# Patient Record
Sex: Female | Born: 1990 | Race: White | Hispanic: No | Marital: Married | Smoking: Never smoker
Health system: Southern US, Community
[De-identification: ages and names within clinical notes are randomized; demographics above are authoritative.]

## PROBLEM LIST (undated history)

## (undated) DIAGNOSIS — I319 Disease of pericardium, unspecified: Secondary | ICD-10-CM

## (undated) DIAGNOSIS — J45909 Unspecified asthma, uncomplicated: Secondary | ICD-10-CM

## (undated) DIAGNOSIS — N200 Calculus of kidney: Secondary | ICD-10-CM

## (undated) DIAGNOSIS — I639 Cerebral infarction, unspecified: Secondary | ICD-10-CM

## (undated) HISTORY — PX: TONSILLECTOMY: SUR1361

---

## 2014-11-23 ENCOUNTER — Emergency Department (HOSPITAL_COMMUNITY)
Admission: EM | Admit: 2014-11-23 | Discharge: 2014-11-23 | Disposition: A | Discharge status: Home / Self Care | Payer: Self-pay | Attending: Emergency Medicine | Admitting: Emergency Medicine

## 2014-11-23 ENCOUNTER — Encounter (HOSPITAL_COMMUNITY): Payer: Self-pay | Admitting: Emergency Medicine

## 2014-11-23 DIAGNOSIS — Z880 Allergy status to penicillin: Secondary | ICD-10-CM | POA: Insufficient documentation

## 2014-11-23 DIAGNOSIS — R1012 Left upper quadrant pain: Secondary | ICD-10-CM | POA: Insufficient documentation

## 2014-11-23 DIAGNOSIS — Z8679 Personal history of other diseases of the circulatory system: Secondary | ICD-10-CM | POA: Insufficient documentation

## 2014-11-23 DIAGNOSIS — Z8673 Personal history of transient ischemic attack (TIA), and cerebral infarction without residual deficits: Secondary | ICD-10-CM | POA: Insufficient documentation

## 2014-11-23 DIAGNOSIS — Z79899 Other long term (current) drug therapy: Secondary | ICD-10-CM | POA: Insufficient documentation

## 2014-11-23 HISTORY — DX: Cerebral infarction, unspecified: I63.9

## 2014-11-23 HISTORY — DX: Disease of pericardium, unspecified: I31.9

## 2014-11-23 LAB — HCG, QUANTITATIVE, PREGNANCY: hCG, Beta Chain, Quant, S: <1 m[IU]/mL (ref <5)

## 2014-11-23 LAB — COMPREHENSIVE METABOLIC PANEL
ALBUMIN: 4.4 g/dL (ref 3.5–5.0)
ALK PHOS: 48 U/L (ref 38–126)
ALT: 13 U/L — AB (ref 14–54)
ANION GAP: 7 (ref 5–15)
AST: 16 U/L (ref 15–41)
BUN: 16 mg/dL (ref 6–20)
CHLORIDE: 105 mmol/L (ref 101–111)
CO2: 28 mmol/L (ref 22–32)
CREATININE: 0.64 mg/dL (ref 0.44–1.00)
Calcium: 9 mg/dL (ref 8.9–10.3)
Glucose, Bld: 106 mg/dL — ABNORMAL HIGH (ref 65–99)
POTASSIUM: 3.8 mmol/L (ref 3.5–5.1)
SODIUM: 140 mmol/L (ref 135–145)
Total Bilirubin: 0.3 mg/dL (ref 0.3–1.2)
Total Protein: 7.1 g/dL (ref 6.5–8.1)

## 2014-11-23 LAB — DIFFERENTIAL
BASOS ABS: 0.1 10*3/uL (ref 0.0–0.1)
BASOS PCT: 1 %
EOS ABS: 0.2 10*3/uL (ref 0.0–0.7)
Eosinophils Relative: 4 %
LYMPHS ABS: 2.3 10*3/uL (ref 0.7–4.0)
Lymphocytes Relative: 43 %
Monocytes Absolute: 0.5 10*3/uL (ref 0.1–1.0)
Monocytes Relative: 9 %
NEUTROS PCT: 43 %
Neutro Abs: 2.3 10*3/uL (ref 1.7–7.7)

## 2014-11-23 LAB — URINE MICROSCOPIC-ADD ON

## 2014-11-23 LAB — I-STAT BETA HCG BLOOD, ED (MC, WL, AP ONLY)

## 2014-11-23 LAB — URINALYSIS, ROUTINE W REFLEX MICROSCOPIC
Bilirubin Urine: NEGATIVE
Glucose, UA: NEGATIVE mg/dL
KETONES UR: NEGATIVE mg/dL
LEUKOCYTES UA: NEGATIVE
NITRITE: NEGATIVE
PH: 6.5 (ref 5.0–8.0)
PROTEIN: NEGATIVE mg/dL
Specific Gravity, Urine: 1.021 (ref 1.005–1.030)
Urobilinogen, UA: 0.2 mg/dL (ref 0.0–1.0)

## 2014-11-23 LAB — LIPASE, BLOOD: LIPASE: 31 U/L (ref 11–51)

## 2014-11-23 LAB — CBC
HCT: 41.7 % (ref 36.0–46.0)
HEMOGLOBIN: 13.9 g/dL (ref 12.0–15.0)
MCH: 29.1 pg (ref 26.0–34.0)
MCHC: 33.3 g/dL (ref 30.0–36.0)
MCV: 87.2 fL (ref 78.0–100.0)
PLATELETS: 215 10*3/uL (ref 150–400)
RBC: 4.78 MIL/uL (ref 3.87–5.11)
RDW: 14.3 % (ref 11.5–15.5)
WBC: 5.3 10*3/uL (ref 4.0–10.5)

## 2014-11-23 MED ORDER — ONDANSETRON 4 MG PO TBDP: ORAL_TABLET | ORAL | Status: DC

## 2014-11-23 MED ORDER — MORPHINE SULFATE (PF) 2 MG/ML IV SOLN
2.0000 mg | Freq: Once | INTRAVENOUS | Status: AC
  Administered 2014-11-23: 2 mg via INTRAVENOUS
  Filled 2014-11-23: qty 1

## 2014-11-23 MED ORDER — SODIUM CHLORIDE 0.9 % IV BOLUS (SEPSIS)
1000.0000 mL | Freq: Once | INTRAVENOUS | Status: AC
  Administered 2014-11-23: 1000 mL via INTRAVENOUS

## 2014-11-23 MED ORDER — LIDOCAINE VISCOUS 2 % MT SOLN
15.0000 mL | Freq: Once | OROMUCOSAL | Status: AC
  Administered 2014-11-23: 15 mL via OROMUCOSAL
  Filled 2014-11-23: qty 15

## 2014-11-23 MED ORDER — ALUM & MAG HYDROXIDE-SIMETH 200-200-20 MG/5ML PO SUSP
15.0000 mL | Freq: Once | ORAL | Status: AC
  Administered 2014-11-23: 15 mL via ORAL
  Filled 2014-11-23: qty 30

## 2014-11-23 MED ORDER — ONDANSETRON HCL 4 MG/2ML IJ SOLN
4.0000 mg | Freq: Once | INTRAMUSCULAR | Status: AC
  Administered 2014-11-23: 4 mg via INTRAVENOUS
  Filled 2014-11-23: qty 2

## 2014-11-23 NOTE — ED Notes (Signed)
Patient c/o LUQ pain, sharp in nature for several days, patient states pain is progressively worsening. Patient reports emesis too numerous to count. Denies fever. Denies diarrhea.

## 2014-11-23 NOTE — Discharge Instructions (Signed)
Try Zantac twice a day for your pain Abdominal Pain, Adult Many things can cause abdominal pain. Usually, abdominal pain is not caused by a disease and will improve without treatment. It can often be observed and treated at home. Your health care provider will do a physical exam and possibly order blood tests and X-rays to help determine the seriousness of your pain. However, in many cases, more time must pass before a clear cause of the pain can be found. Before that point, your health care provider may not know if you need more testing or further treatment. HOME CARE INSTRUCTIONS Monitor your abdominal pain for any changes. The following actions may help to alleviate any discomfort you are experiencing:  Only take over-the-counter or prescription medicines as directed by your health care provider.  Do not take laxatives unless directed to do so by your health care provider.  Try a clear liquid diet (broth, tea, or water) as directed by your health care provider. Slowly move to a bland diet as tolerated. SEEK MEDICAL CARE IF:  You have unexplained abdominal pain.  You have abdominal pain associated with nausea or diarrhea.  You have pain when you urinate or have a bowel movement.  You experience abdominal pain that wakes you in the night.  You have abdominal pain that is worsened or improved by eating food.  You have abdominal pain that is worsened with eating fatty foods.  You have a fever. SEEK IMMEDIATE MEDICAL CARE IF:  Your pain does not go away within 2 hours.  You keep throwing up (vomiting).  Your pain is felt only in portions of the abdomen, such as the right side or the left lower portion of the abdomen.  You pass bloody or black tarry stools. MAKE SURE YOU:  Understand these instructions.  Will watch your condition.  Will get help right away if you are not doing well or get worse.   This information is not intended to replace advice given to you by your health  care provider. Make sure you discuss any questions you have with your health care provider.   Document Released: 10/11/2004 Document Revised: 09/22/2014 Document Reviewed: 09/10/2012 Elsevier Interactive Patient Education [employer]2016 [employer]vier Inc.

## 2014-11-23 NOTE — ED Provider Notes (Signed)
CSN: [id number]     Arrival date & time 11/23/14  0845 History   First MD Initiated Contact with Patient 11/23/14 0902     Chief Complaint  Patient presents with  . Abdominal Pain    LUQ     (Consider location/radiation/quality/duration/timing/severity/associated sxs/prior Treatment) Patient is a 24 y.o. female presenting with abdominal pain. The history is provided by the patient.  Abdominal Pain Pain location:  LUQ Pain quality: sharp and shooting   Pain radiates to:  Does not radiate Pain severity:  Moderate Onset quality:  Gradual Duration:  4 days Timing:  Constant Progression:  Worsening Chronicity:  New Relieved by:  Nothing Worsened by:  Nothing tried Ineffective treatments:  None tried Associated symptoms: no chest pain, no chills, no constipation, no diarrhea, no dysuria, no fever, no nausea, no shortness of breath, no vaginal bleeding, no vaginal discharge and no vomiting     24 yo F with a chief complaint of epigastric and left upper quadrant abdominal pain. This been going on for the past few days. Patient has had some significant vomiting with this as well. Able to tolerate some liquids at home. Patient having some subjective chills. Denies fevers. Denies diarrhea. Denies suspicious food intake. Patient denies bilious or bloody emesis. Pain was initially colicky now she feels is constant. Denies shortness of breath.  Past Medical History  Diagnosis Date  . Pericarditis   . Stroke [facility](HCC)     x2   Past Surgical History  Procedure Laterality Date  . Tonsillectomy     No family history on file. Social History  Substance Use Topics  . Smoking status: Never Smoker   . Smokeless tobacco: None  . Alcohol Use: Yes     Comment: occ   OB History    No data available     Review of Systems  Constitutional: Negative for fever and chills.  HENT: Negative for congestion and rhinorrhea.   Eyes: Negative for redness and visual disturbance.  Respiratory: Negative for  shortness of breath and wheezing.   Cardiovascular: Negative for chest pain and palpitations.  Gastrointestinal: Positive for abdominal pain. Negative for nausea, vomiting, diarrhea and constipation.  Genitourinary: Negative for dysuria, urgency, vaginal bleeding and vaginal discharge.  Musculoskeletal: Negative for myalgias and arthralgias.  Skin: Negative for pallor and wound.  Neurological: Negative for dizziness and headaches.      Allergies  Peanuts and Penicillins  Home Medications   Prior to Admission medications   Medication Sig Start Date End Date Taking? Authorizing Provider  albuterol (PROVENTIL HFA;VENTOLIN HFA) 108 (90 BASE) MCG/ACT inhaler Inhale 2 puffs into the lungs every 6 (six) hours as needed for wheezing or shortness of breath.   Yes Historical Provider, MD  ibuprofen (ADVIL,MOTRIN) 200 MG tablet Take 400 mg by mouth every 6 (six) hours as needed for fever, headache, mild pain, moderate pain or cramping.   Yes Historical Provider, MD  ondansetron (ZOFRAN ODT) 4 MG disintegrating tablet 4mg  ODT q4 hours prn nausea/vomit 11/23/14   Melene Planan Iliyana Convey, DO   BP 116/79 mmHg  Pulse 99  Temp(Src) 98.3 F (36.8 C) (Oral)  Resp 18  Ht 5\' 3"  (1.6 m)  Wt 105 lb (47.628 kg)  BMI 18.60 kg/m2  SpO2 99%  LMP 11/04/2014 (Exact Date) Physical Exam  Constitutional: She is oriented to person, place, and time. She appears well-developed and well-nourished. No distress.  HENT:  Head: Normocephalic and atraumatic.  Eyes: EOM are normal. Pupils are equal, round, and reactive to  light.  Neck: Normal range of motion. Neck supple.  Cardiovascular: Normal rate and regular rhythm.  Exam reveals no gallop and no friction rub.   No murmur heard. Pulmonary/Chest: Effort normal. She has no wheezes. She has no rales.  Abdominal: Soft. She exhibits no distension. There is tenderness (mild epigastric). There is no rebound and no guarding.  Musculoskeletal: She exhibits no edema or tenderness.   Neurological: She is alert and oriented to person, place, and time.  Skin: Skin is warm and dry. She is not diaphoretic.  Psychiatric: She has a normal mood and affect. Her behavior is normal.  Nursing note and vitals reviewed.   ED Course  Procedures (including critical care time) Labs Review Labs Reviewed  COMPREHENSIVE METABOLIC PANEL - Abnormal; Notable for the following:    Glucose, Bld 106 (*)    ALT 13 (*)    All other components within normal limits  URINALYSIS, ROUTINE W REFLEX MICROSCOPIC (NOT AT [facility]) - Abnormal; Notable for the following:    APPearance CLOUDY (*)    Hgb urine dipstick SMALL (*)    All other components within normal limits  LIPASE, BLOOD  CBC  HCG, QUANTITATIVE, PREGNANCY  DIFFERENTIAL  URINE MICROSCOPIC-ADD ON  I-STAT BETA HCG BLOOD, ED ([facility], [facility], [facility] ONLY)    Imaging Review No results found. I have personally reviewed and evaluated these images and lab results as part of my medical decision-making.   EKG Interpretation None      MDM   Final diagnoses:  Left upper quadrant pain    24 yo F with a chief complaint of left upper quadrant abdominal pain. This been going on for about 3 or 4 days. Patient with a benign abdominal exam. Likely gastritis secondary to vomiting. Will treat with Zofran and GI cocktail check CBC CMP lipase.  Pregnancy test lost, will have to be re run in the lab.  Pregnant test negative. Patient feeling much better after GI cocktail. Able to tolerate PODischarge home.   I have discussed the diagnosis/risks/treatment options with the patient and family and believe the pt to be eligible for discharge home to follow-up with PCP. We also discussed returning to the ED immediately if new or worsening sx occur. We discussed the sx which are most concerning (e.g., sudden worsening pain, fever, inability to tolerate by mouth ) that necessitate immediate return. Medications administered to the patient during their visit and any new  prescriptions provided to the patient are listed below.  Medications given during this visit Medications  ondansetron (ZOFRAN) injection 4 mg (4 mg Intravenous Given 11/23/14 0943)  morphine 2 MG/ML injection 2 mg (2 mg Intravenous Given 11/23/14 0942)  alum & mag hydroxide-simeth (MAALOX/MYLANTA) 200-200-20 MG/5ML suspension 15 mL (15 mLs Oral Given 11/23/14 0944)  lidocaine (XYLOCAINE) 2 % viscous mouth solution 15 mL (15 mLs Mouth/Throat Given 11/23/14 0944)  sodium chloride 0.9 % bolus 1,000 mL (0 mLs Intravenous Stopped 11/23/14 1040)    Discharge Medication List as of 11/23/2014 11:44 AM      The patient appears reasonably screen and/or stabilized for discharge and I doubt any other medical condition or other [facility] requiring further screening, evaluation, or treatment in the ED at this time prior to discharge.    Melene Plan, DO 11/23/14 1527

## 2014-12-15 ENCOUNTER — Encounter (HOSPITAL_COMMUNITY): Payer: Self-pay | Admitting: Emergency Medicine

## 2014-12-15 ENCOUNTER — Emergency Department (INDEPENDENT_AMBULATORY_CARE_PROVIDER_SITE_OTHER)
Admission: EM | Admit: 2014-12-15 | Discharge: 2014-12-15 | Disposition: A | Discharge status: Home / Self Care | Payer: Self-pay | Source: Home / Self Care

## 2014-12-15 DIAGNOSIS — R1084 Generalized abdominal pain: Secondary | ICD-10-CM

## 2014-12-15 DIAGNOSIS — R634 Abnormal weight loss: Secondary | ICD-10-CM

## 2014-12-15 LAB — POCT URINALYSIS DIP (DEVICE)
Bilirubin Urine: NEGATIVE
Glucose, UA: NEGATIVE mg/dL
KETONES UR: NEGATIVE mg/dL
LEUKOCYTES UA: NEGATIVE
Nitrite: NEGATIVE
Protein, ur: NEGATIVE mg/dL
UROBILINOGEN UA: 0.2 mg/dL (ref 0.0–1.0)
pH: 6 (ref 5.0–8.0)

## 2014-12-15 LAB — POCT I-STAT, CHEM 8
BUN: 14 mg/dL (ref 6–20)
CALCIUM ION: 1.24 mmol/L — AB (ref 1.12–1.23)
Chloride: 101 mmol/L (ref 101–111)
Creatinine, Ser: 0.7 mg/dL (ref 0.44–1.00)
Glucose, Bld: 98 mg/dL (ref 65–99)
HCT: 49 % — ABNORMAL HIGH (ref 36.0–46.0)
HEMOGLOBIN: 16.7 g/dL — AB (ref 12.0–15.0)
Potassium: 4.2 mmol/L (ref 3.5–5.1)
SODIUM: 140 mmol/L (ref 135–145)
TCO2: 24 mmol/L (ref 0–100)

## 2014-12-15 LAB — POCT PREGNANCY, URINE: Preg Test, Ur: NEGATIVE

## 2014-12-15 LAB — POCT H PYLORI SCREEN: H. PYLORI SCREEN, POC: NEGATIVE

## 2014-12-15 MED ORDER — ONDANSETRON 4 MG PO TBDP
4.0000 mg | ORAL_TABLET | Freq: Once | ORAL | Status: AC
  Administered 2014-12-15: 4 mg via ORAL

## 2014-12-15 MED ORDER — PROMETHAZINE HCL 12.5 MG PO TABS: 12.5000 mg | ORAL_TABLET | Freq: Four times a day (QID) | ORAL | Status: DC | PRN

## 2014-12-15 MED ORDER — ONDANSETRON 4 MG PO TBDP
ORAL_TABLET | ORAL | Status: AC
  Filled 2014-12-15: qty 1

## 2014-12-15 NOTE — ED Notes (Signed)
Complains of left upper abdominal pain at lower rib cage, denies radiation. Pain started 3 weeks ago, stopped, then resumed 1 1/2 weeks ago.  Patient has nausea, not triggered by anything per patient.  Patient reports being seen in ed, prescribed zofran-but too expensive and did not get this filled.  Patient also reports vaginal bleeding for 2 months.  Denies any change in medicines or changes in birth control.  No pcp.

## 2014-12-15 NOTE — Discharge Instructions (Signed)

## 2014-12-15 NOTE — ED Provider Notes (Signed)
CSN: [id number]     Arrival date & time 12/15/14  1321 History   None    Chief Complaint  Patient presents with  . Abdominal Pain   (Consider location/radiation/quality/duration/timing/severity/associated sxs/prior Treatment) HPI Abdominal pain for over 3 weeks. Has been seen in the ED without any imaging, also has had vaginal bleeding for over 2 months. Was treated with zofran but unable to get rx due to cost. Upper abdo pain without radiation. Some weight loss. Not eating well, trying to take fluids.  Past Medical History  Diagnosis Date  . Pericarditis   . Stroke [facility](HCC)     x2   Past Surgical History  Procedure Laterality Date  . Tonsillectomy     History reviewed. No pertinent family history. Social History  Substance Use Topics  . Smoking status: Never Smoker   . Smokeless tobacco: None  . Alcohol Use: Yes     Comment: occ   OB History    No data available     Review of Systems  ROS +'venausea abdo pain  Denies: HEADACHE, CHEST PAIN, CONGESTION, DYSURIA, SHORTNESS OF BREATH   Allergies  Peanuts and Penicillins  Home Medications   Prior to Admission medications   Medication Sig Start Date End Date Taking? Authorizing Provider  albuterol (PROVENTIL HFA;VENTOLIN HFA) 108 (90 BASE) MCG/ACT inhaler Inhale 2 puffs into the lungs every 6 (six) hours as needed for wheezing or shortness of breath.    Historical Provider, MD  ibuprofen (ADVIL,MOTRIN) 200 MG tablet Take 400 mg by mouth every 6 (six) hours as needed for fever, headache, mild pain, moderate pain or cramping.    Historical Provider, MD  ondansetron (ZOFRAN ODT) 4 MG disintegrating tablet 4mg  ODT q4 hours prn nausea/vomit 11/23/14   Melene Planan Floyd, DO  promethazine (PHENERGAN) 12.5 MG tablet Take 1 tablet (12.5 mg total) by mouth every 6 (six) hours as needed for nausea or vomiting. 12/15/14   Tharon AquasFrank C Patrick, PA   Meds Ordered and Administered this Visit   Medications  promethazine (PHENERGAN) tablet 12.5 mg (not  administered)  ondansetron (ZOFRAN-ODT) disintegrating tablet 4 mg (4 mg Oral Given 12/15/14 1531)    BP 123/87 mmHg  Pulse 68  Temp(Src) 99.3 F (37.4 C) (Oral)  Resp 16  SpO2 99%  LMP 11/04/2014 (Exact Date) No data found.   Physical Exam  Constitutional: She is oriented to person, place, and time. She appears well-developed and well-nourished.  HENT:  Head: Normocephalic and atraumatic.  Pulmonary/Chest: Effort normal and breath sounds normal.  Abdominal: Soft. Bowel sounds are normal. She exhibits no distension. There is no tenderness. There is no rebound and no guarding.  Musculoskeletal: Normal range of motion.  Neurological: She is alert and oriented to person, place, and time.  Skin: Skin is warm and dry.  Psychiatric: She has a normal mood and affect. Her behavior is normal. Judgment and thought content normal.    ED Course  Procedures (including critical care time)  Labs Review Labs Reviewed  POCT URINALYSIS DIP (DEVICE) - Abnormal; Notable for the following:    Hgb urine dipstick MODERATE (*)    All other components within normal limits  POCT I-STAT, CHEM 8 - Abnormal; Notable for the following:    Calcium, Ion 1.24 (*)    Hemoglobin 16.7 (*)    HCT 49.0 (*)    All other components within normal limits  POCT PREGNANCY, URINE  POCT H PYLORI SCREEN    Imaging Review No results found.   Visual  Acuity Review  Right Eye Distance:   Left Eye Distance:   Bilateral Distance:    Right Eye Near:   Left Eye Near:    Bilateral Near:         MDM   1. Generalized abdominal pain   2. Weight loss    No direct or obvious cause for patients symptomatology. Suggest following up as needed.  Most likely will need some investigations to get to this resolve.  Rx phenergan Community resource provided to help find PCP  Tharon Aquas, PA 12/15/14 1647  Tharon Aquas, [place] 12/15/14 2033

## 2015-08-11 ENCOUNTER — Emergency Department (HOSPITAL_COMMUNITY): Payer: Managed Care, Other (non HMO)

## 2015-08-11 ENCOUNTER — Encounter (HOSPITAL_COMMUNITY): Payer: Self-pay | Admitting: Emergency Medicine

## 2015-08-11 ENCOUNTER — Emergency Department (HOSPITAL_COMMUNITY)
Admission: EM | Admit: 2015-08-11 | Discharge: 2015-08-11 | Disposition: A | Discharge status: Home / Self Care | Payer: Managed Care, Other (non HMO) | Attending: Physician Assistant | Admitting: Physician Assistant

## 2015-08-11 DIAGNOSIS — R42 Dizziness and giddiness: Secondary | ICD-10-CM | POA: Diagnosis present

## 2015-08-11 DIAGNOSIS — R55 Syncope and collapse: Secondary | ICD-10-CM | POA: Diagnosis not present

## 2015-08-11 DIAGNOSIS — Z791 Long term (current) use of non-steroidal anti-inflammatories (NSAID): Secondary | ICD-10-CM | POA: Insufficient documentation

## 2015-08-11 DIAGNOSIS — R109 Unspecified abdominal pain: Secondary | ICD-10-CM | POA: Diagnosis not present

## 2015-08-11 DIAGNOSIS — Z8673 Personal history of transient ischemic attack (TIA), and cerebral infarction without residual deficits: Secondary | ICD-10-CM | POA: Insufficient documentation

## 2015-08-11 DIAGNOSIS — Z79899 Other long term (current) drug therapy: Secondary | ICD-10-CM | POA: Diagnosis not present

## 2015-08-11 LAB — CBC
HEMATOCRIT: 41.9 % (ref 36.0–46.0)
HEMOGLOBIN: 14.1 g/dL (ref 12.0–15.0)
MCH: 29.4 pg (ref 26.0–34.0)
MCHC: 33.7 g/dL (ref 30.0–36.0)
MCV: 87.5 fL (ref 78.0–100.0)
Platelets: 255 10*3/uL (ref 150–400)
RBC: 4.79 MIL/uL (ref 3.87–5.11)
RDW: 13.1 % (ref 11.5–15.5)
WBC: 8.9 10*3/uL (ref 4.0–10.5)

## 2015-08-11 LAB — URINE MICROSCOPIC-ADD ON

## 2015-08-11 LAB — URINALYSIS, ROUTINE W REFLEX MICROSCOPIC
BILIRUBIN URINE: NEGATIVE
Glucose, UA: NEGATIVE mg/dL
Ketones, ur: NEGATIVE mg/dL
Leukocytes, UA: NEGATIVE
Nitrite: NEGATIVE
PH: 6 (ref 5.0–8.0)
Protein, ur: NEGATIVE mg/dL
SPECIFIC GRAVITY, URINE: 1.021 (ref 1.005–1.030)

## 2015-08-11 LAB — BASIC METABOLIC PANEL
Anion gap: 6 (ref 5–15)
BUN: 9 mg/dL (ref 6–20)
CHLORIDE: 106 mmol/L (ref 101–111)
CO2: 25 mmol/L (ref 22–32)
Calcium: 8.8 mg/dL — ABNORMAL LOW (ref 8.9–10.3)
Creatinine, Ser: 0.69 mg/dL (ref 0.44–1.00)
GFR calc non Af Amer: >60 mL/min (ref >60)
Glucose, Bld: 120 mg/dL — ABNORMAL HIGH (ref 65–99)
Potassium: 3.2 mmol/L — ABNORMAL LOW (ref 3.5–5.1)
SODIUM: 137 mmol/L (ref 135–145)

## 2015-08-11 LAB — I-STAT BETA HCG BLOOD, ED (MC, WL, AP ONLY)

## 2015-08-11 LAB — RAPID URINE DRUG SCREEN, HOSP PERFORMED
AMPHETAMINES: NOT DETECTED
BARBITURATES: NOT DETECTED
Benzodiazepines: NOT DETECTED
COCAINE: NOT DETECTED
Opiates: NOT DETECTED
Tetrahydrocannabinol: POSITIVE — AB

## 2015-08-11 MED ORDER — SODIUM CHLORIDE 0.9 % IV BOLUS (SEPSIS)
1000.0000 mL | Freq: Once | INTRAVENOUS | Status: AC
  Administered 2015-08-11: 1000 mL via INTRAVENOUS

## 2015-08-11 MED ORDER — DIAZEPAM 5 MG/ML IJ SOLN
2.5000 mg | Freq: Once | INTRAMUSCULAR | Status: AC
  Administered 2015-08-11: 2.5 mg via INTRAVENOUS
  Filled 2015-08-11: qty 2

## 2015-08-11 MED ORDER — MECLIZINE HCL 25 MG PO TABS: 25.0000 mg | ORAL_TABLET | Freq: Three times a day (TID) | ORAL | 0 refills | Status: DC | PRN

## 2015-08-11 MED ORDER — KETOROLAC TROMETHAMINE 30 MG/ML IJ SOLN
30.0000 mg | Freq: Once | INTRAMUSCULAR | Status: AC
  Administered 2015-08-11: 30 mg via INTRAVENOUS
  Filled 2015-08-11: qty 1

## 2015-08-11 NOTE — ED Provider Notes (Signed)
[facility]-EMERGENCY DEPT Provider Note   CSN: [id number] Arrival date & time: 08/11/15  1625  First Provider Contact:  None       History   Chief Complaint Chief Complaint  Patient presents with  . Dizziness    and Headache    HPI Robin Lambert is a 25 y.o. female.  Robin Lambert is a 25 y.o. female with a history of pericarditis and self-reported CVA, presents emergency department after syncopal episode 2 days ago. She states that she got up out of sitting position and states she became very dizzy. She states that she then fainted. This was witnessed by her boyfriend. She states she was unconscious for several seconds. When she woke up, she remembers feeling headache. She states since then she has had headache, nausea, vomiting, diarrhea, generalized weakness. She states she has been drinking water, but still feels dehydrated. She reports dizziness has persisted since then. She denies chest pain palpitations. No recent prolonged travel. No pain or swelling in her legs.      Past Medical History:  Diagnosis Date  . Pericarditis   . Stroke [facility])    x2    There are no active problems to display for this patient.   Past Surgical History:  Procedure Laterality Date  . TONSILLECTOMY      OB History    No data available       Home Medications    Prior to Admission medications   Medication Sig Start Date End Date Taking? Authorizing Provider  albuterol (PROVENTIL HFA;VENTOLIN HFA) 108 (90 BASE) MCG/ACT inhaler Inhale 2 puffs into the lungs every 6 (six) hours as needed for wheezing or shortness of breath.   Yes Historical Provider, MD  EPINEPHrine (EPIPEN JR) 0.15 MG/0.3ML injection Inject 0.15 mg into the muscle as needed for anaphylaxis.    Yes Historical Provider, MD  ibuprofen (ADVIL,MOTRIN) 200 MG tablet Take 400 mg by mouth every 4 (four) hours as needed for fever, headache, mild pain, moderate pain or cramping.    Yes Historical Provider, MD    Family  History History reviewed. No pertinent family history.  Social History Social History  Substance Use Topics  . Smoking status: Never Smoker  . Smokeless tobacco: Not on file  . Alcohol use Yes     Comment: occ     Allergies   Peanuts [peanut oil] and Penicillins   Review of Systems Review of Systems  Constitutional: Negative for chills and fever.  Respiratory: Negative for cough, chest tightness and shortness of breath.   Cardiovascular: Negative for chest pain, palpitations and leg swelling.  Gastrointestinal: Positive for abdominal pain, nausea and vomiting. Negative for diarrhea.  Genitourinary: Negative for dysuria, flank pain, pelvic pain, vaginal bleeding, vaginal discharge and vaginal pain.  Musculoskeletal: Negative for arthralgias, myalgias, neck pain and neck stiffness.  Skin: Negative for rash.  Neurological: Positive for dizziness, syncope, light-headedness and headaches. Negative for weakness.  All other systems reviewed and are negative.    Physical Exam Updated Vital Signs BP 120/81   Pulse (!) 56   Temp 98.2 F (36.8 C) (Oral)   Resp 14   SpO2 100%   Physical Exam  Constitutional: She is oriented to person, place, and time. She appears well-developed and well-nourished. No distress.  HENT:  Head: Normocephalic.  Oral mucosa dry, lips cracked  Eyes: Conjunctivae and EOM are normal. Pupils are equal, round, and reactive to light.  No nystagmus  Neck: Normal range of motion. Neck supple.  Cardiovascular:  Normal rate, regular rhythm and normal heart sounds.   Pulmonary/Chest: Effort normal and breath sounds normal. No respiratory distress. She has no wheezes. She has no rales.  Abdominal: Soft. Bowel sounds are normal. She exhibits no distension. There is no tenderness. There is no rebound and no guarding.  Musculoskeletal: She exhibits no edema.  Neurological: She is alert and oriented to person, place, and time.  5/5 and equal upper and lower  extremity strength bilaterally. Equal grip strength bilaterally. Normal finger to nose and heel to shin. No pronator drift. Gait normal  Skin: Skin is warm and dry.  Psychiatric: She has a normal mood and affect. Her behavior is normal.  Nursing note and vitals reviewed.    ED Treatments / Results  Labs (all labs ordered are listed, but only abnormal results are displayed) Labs Reviewed  BASIC METABOLIC PANEL - Abnormal; Notable for the following:       Result Value   Potassium 3.2 (*)    Glucose, Bld 120 (*)    Calcium 8.8 (*)    All other components within normal limits  URINALYSIS, ROUTINE W REFLEX MICROSCOPIC (NOT AT [facility]) - Abnormal; Notable for the following:    Hgb urine dipstick SMALL (*)    All other components within normal limits  URINE RAPID DRUG SCREEN, HOSP PERFORMED - Abnormal; Notable for the following:    Tetrahydrocannabinol POSITIVE (*)    All other components within normal limits  URINE MICROSCOPIC-ADD ON - Abnormal; Notable for the following:    Squamous Epithelial / LPF 0-5 (*)    Bacteria, UA RARE (*)    All other components within normal limits  CBC  I-STAT BETA HCG BLOOD, ED ([facility], [facility], [facility] ONLY)    EKG  EKG Interpretation  Date/Time:  Thursday August 11 2015 17:13:39 EDT Ventricular Rate:  62 PR Interval:    QRS Duration: 100 QT Interval:  414 QTC Calculation: 421 R Axis:   84 Text Interpretation:  Sinus rhythm Short PR interval RSR' in V1 or V2, right VCD or RVH no acute ischemia. Confirmed by Kandis Mannan ([id number]) on 08/11/2015 9:09:50 PM       Radiology Ct Head Wo Contrast  Result Date: 08/11/2015 CLINICAL DATA:  Pt reports she had a syncopal episode on Tuesday evening. Larey Seat and hit her head when it occurred. Pt reports ongoing dizziness and HA since then. EXAM: CT HEAD WITHOUT CONTRAST TECHNIQUE: Contiguous axial images were obtained from the base of the skull through the vertex without intravenous contrast. COMPARISON:  None. FINDINGS:  Overall the ventricles are normal in size and configuration. There are no parenchymal masses or mass effect, no evidence of an infarct, no extra-axial masses or abnormal fluid collections and no intracranial hemorrhage. No skull fracture. Mild mucosal thickening in the left sphenoid sinus. Remaining visualized sinuses and the mastoid air cells are clear. IMPRESSION: 1. No intracranial abnormality. 2. No skull fracture. Electronically Signed   By: Amie Portland M.D.   On: 08/11/2015 19:28   Procedures Procedures (including critical care time)  Medications Ordered in ED Medications  sodium chloride 0.9 % bolus 1,000 mL (1,000 mLs Intravenous New Bag/Given 08/11/15 1918)  ketorolac (TORADOL) 30 MG/ML injection 30 mg (30 mg Intravenous Given 08/11/15 1918)  diazepam (VALIUM) injection 2.5 mg (2.5 mg Intravenous Given 08/11/15 2113)     Initial Impression / Assessment and Plan / ED Course  I have reviewed the triage vital signs and the nursing notes.  Pertinent labs & imaging results  that were available during my care of the patient were reviewed by me and considered in my medical decision making (see chart for details).  Clinical Course  Comment By Time  Patient with syncope 2 days ago, today with persistent dizziness, headache, nausea, vomiting, diarrhea. She appears to be in no acute distress, lips are dry, tongue dry. She is not orthostatic. Will start IV fluids, labs, urinalysis, CT head pending. Jaynie Crumble, PA-C 07/27 1911  Pt still dizzy. She is not orthostatic. Her CT is negative. Preg test negative. No acute findings on labs. Trop negative. Sinus rhythm on ECG. Pt now states that she feels like everything is spinning. Will try valium  Jaynie Crumble, PA-C 07/27 2110    Final Clinical Impressions(s) / ED Diagnoses   Final diagnoses:  Syncope, unspecified syncope type  Vertigo    New Prescriptions New Prescriptions   MECLIZINE (ANTIVERT) 25 MG TABLET    Take 1 tablet (25  mg total) by mouth 3 (three) times daily as needed for dizziness.     Jaynie Crumble, PA-C 08/11/15 2144    Courteney Randall An, MD 08/11/15 [phone]

## 2015-08-11 NOTE — ED Notes (Signed)
Patient transported to CT 

## 2015-08-11 NOTE — Discharge Instructions (Signed)
Make sure to rest, drink plenty of fluids. Meclizine as prescribed for dizziness. Follow up with family doctor.

## 2015-08-11 NOTE — ED Triage Notes (Signed)
Pt reports she had a syncopal episode on Tuesday evening. Larey Seat and hit her head when it occurred. Pt reports ongoing dizziness and HA since then.

## 2016-09-12 ENCOUNTER — Encounter: Payer: Self-pay | Admitting: Emergency Medicine

## 2016-09-12 ENCOUNTER — Ambulatory Visit (INDEPENDENT_AMBULATORY_CARE_PROVIDER_SITE_OTHER): Payer: Managed Care, Other (non HMO) | Admitting: Emergency Medicine

## 2016-09-12 VITALS — BP 101/62 | HR 73 | Temp 98.3°F | Resp 16 | Ht 63.0 in | Wt 115.0 lb

## 2016-09-12 DIAGNOSIS — R531 Weakness: Secondary | ICD-10-CM

## 2016-09-12 DIAGNOSIS — R42 Dizziness and giddiness: Secondary | ICD-10-CM

## 2016-09-12 LAB — POCT CBC
Granulocyte percent: 60.7 %G (ref 37–80)
HCT, POC: 40.6 % (ref 37.7–47.9)
HEMOGLOBIN: 13.8 g/dL (ref 12.2–16.2)
Lymph, poc: 2 (ref 0.6–3.4)
MCH, POC: 29.5 pg (ref 27–31.2)
MCHC: 34 g/dL (ref 31.8–35.4)
MCV: 86.6 fL (ref 80–97)
MID (cbc): 0.4 (ref 0–0.9)
MPV: 7.3 fL (ref 0–99.8)
POC Granulocyte: 3.7 (ref 2–6.9)
POC LYMPH PERCENT: 33 %L (ref 10–50)
POC MID %: 6.3 %M (ref 0–12)
Platelet Count, POC: 252 10*3/uL (ref 142–424)
RBC: 4.69 M/uL (ref 4.04–5.48)
RDW, POC: 14.2 %
WBC: 6.1 10*3/uL (ref 4.6–10.2)

## 2016-09-12 LAB — POCT URINALYSIS DIP (MANUAL ENTRY)
Bilirubin, UA: NEGATIVE
GLUCOSE UA: NEGATIVE mg/dL
Ketones, POC UA: NEGATIVE mg/dL
Leukocytes, UA: NEGATIVE
Nitrite, UA: NEGATIVE
Protein Ur, POC: NEGATIVE mg/dL
SPEC GRAV UA: 1.02 (ref 1.010–1.025)
UROBILINOGEN UA: 0.2 U/dL
pH, UA: 7.5 (ref 5.0–8.0)

## 2016-09-12 LAB — POCT URINE PREGNANCY: Preg Test, Ur: NEGATIVE

## 2016-09-12 NOTE — Patient Instructions (Addendum)
     IF you received an x-ray today, you will receive an invoice from [facility]. Please contact [facility] at [phone] with questions or concerns regarding your invoice.   IF you received labwork today, you will receive an invoice from [facility]. Please contact [facility] at [phone] with questions or concerns regarding your invoice.   Our billing staff will not be able to assist you with questions regarding bills from these companies.  You will be contacted with the lab results as soon as they are available. The fastest way to get your results is to activate your My Chart account. Instructions are located on the last page of this paperwork. If you have not heard from us regarding the results in 2 weeks, please contact this office.     Weakness Weakness is a lack of strength. You may feel weak all over your body (generalized), or you may feel weak in one specific part of your body (focal). There are many potential causes of weakness. Sometimes, the cause of your weakness may not be known. Some causes of weakness can be serious, so it is important to see your doctor. Follow these instructions at home:  Rest as needed.  Try to get enough sleep. Talk to your doctor about how much sleep you need each night.  Take over-the-counter and prescription medicines only as told by your doctor.  Eat a healthy, well-balanced diet. This includes: ? Proteins to build muscles, such as lean meats and fish. ? Fresh fruits and vegetables. ? Carbohydrates to boost energy, such as whole grains.  Drink enough fluid to keep your pee (urine) clear or pale yellow.  Do strength exercises, such as arm curls and leg raises, for 30 minutes at least 2 days a week or as told by your doctor.  Think about working with a physical therapist or trainer to help you get stronger.  Keep all follow-up visits as told by your doctor. This is important. Contact a doctor if:  Your weakness does not  get better or it gets worse.  Your weakness affects your ability to: ? Think clearly. ? Do your normal daily activities. Get help right away if:  You have sudden weakness.  You have trouble breathing or shortness of breath.  You have problems with your vision.  You have trouble talking or swallowing.  You have trouble standing or walking.  You have chest pain.  You are light-headed.  You pass out (lose consciousness). This information is not intended to replace advice given to you by your health care provider. Make sure you discuss any questions you have with your health care provider. Document Released: 12/15/2007 Document Revised: 01/27/2015 Document Reviewed: 10/22/2014 Elsevier Interactive Patient Education  2018 Elsevier Inc.  

## 2016-09-12 NOTE — Progress Notes (Signed)
Robin Lambert 26 y.o.   Chief Complaint  Patient presents with  . Dizziness    WITH NAUSEA  X 2-MONTHS AGO  . Loss of Consciousness    per patient she fainted x 2 months ago    HISTORY OF PRESENT ILLNESS: This is a 26 y.o. female complaining of feeling weak x 4-5 days; last weekend she felt dizzy, confused, nauseous, had shaking hands; sometimes she gets dizzy when getting up too quick. No other significan t symptoms.  Dizziness  This is a recurrent problem. The current episode started in the past 7 days. The problem occurs intermittently. The problem has been waxing and waning. Associated symptoms include fatigue, nausea and weakness. Pertinent negatives include no abdominal pain, chest pain, chills, congestion, coughing, fever, headaches, joint swelling, myalgias, rash, sore throat, urinary symptoms, vertigo, visual change or vomiting. Nothing aggravates the symptoms. She has tried nothing for the symptoms.     Prior to Admission medications   Medication Sig Start Date End Date Taking? Authorizing Provider  albuterol (PROVENTIL HFA;VENTOLIN HFA) 108 (90 BASE) MCG/ACT inhaler Inhale 2 puffs into the lungs every 6 (six) hours as needed for wheezing or shortness of breath.   Yes [provider]  EPINEPHrine (EPIPEN JR) 0.15 MG/0.3ML injection Inject 0.15 mg into the muscle as needed for anaphylaxis.    Yes [provider]  ibuprofen (ADVIL,MOTRIN) 200 MG tablet Take 400 mg by mouth every 4 (four) hours as needed for fever, headache, mild pain, moderate pain or cramping.    Yes [provider]  meclizine (ANTIVERT) 25 MG tablet Take 1 tablet (25 mg total) by mouth 3 (three) times daily as needed for dizziness. Patient not taking: Reported on 09/12/2016 08/11/15   Jaynie Crumble, PA-C    Allergies  Allergen Reactions  . Peanuts [Peanut Oil] Anaphylaxis  . Penicillins Other (See Comments)    Unknown, childhood reaction Has patient had a PCN reaction  causing immediate rash, facial/tongue/throat swelling, SOB or lightheadedness with hypotension: unknown Has patient had a PCN reaction causing severe rash involving mucus membranes or skin necrosis: unknown Has patient had a PCN reaction that required hospitalization: unknown  Has patient had a PCN reaction occurring within the last 10 years: No If all of the above answers are "NO", then may proceed with Cephalosporin use.     There are no active problems to display for this patient.   Past Medical History:  Diagnosis Date  . Pericarditis   . Stroke [facility])    x2    Past Surgical History:  Procedure Laterality Date  . TONSILLECTOMY      Social History   Social History  . Marital status: Single    Spouse name: N/A  . Number of children: N/A  . Years of education: N/A   Occupational History  . Not on file.   Social History Main Topics  . Smoking status: Never Smoker  . Smokeless tobacco: Never Used  . Alcohol use Yes     Comment: occ  . Drug use: No  . Sexual activity: Yes    Birth control/ protection: None   Other Topics Concern  . Not on file   Social History Narrative  . No narrative on file    No family history on file.   Review of Systems  Constitutional: Positive for fatigue. Negative for chills and fever.  HENT: Negative.  Negative for congestion and sore throat.   Eyes: Negative.   Respiratory: Negative for cough, hemoptysis and shortness  of breath.   Cardiovascular: Negative for chest pain, palpitations and leg swelling.  Gastrointestinal: Positive for nausea. Negative for abdominal pain, diarrhea and vomiting.  Genitourinary: Negative.  Negative for dysuria and hematuria.  Musculoskeletal: Negative for joint swelling and myalgias.  Skin: Negative.  Negative for rash.  Neurological: Positive for dizziness and weakness. Negative for vertigo, sensory change, focal weakness and headaches.  Endo/Heme/Allergies: Negative.   All other systems reviewed  and are negative.  Vitals:   09/12/16 1209  BP: 101/62  Pulse: 73  Resp: 16  Temp: 98.3 F (36.8 C)  SpO2: 97%     Physical Exam  Constitutional: She is oriented to person, place, and time. She appears well-developed and well-nourished.  HENT:  Head: Normocephalic and atraumatic.  Right Ear: External ear normal.  Left Ear: External ear normal.  Nose: Nose normal.  Mouth/Throat: Oropharynx is clear and moist.  Eyes: Pupils are equal, round, and reactive to light. Conjunctivae and EOM are normal.  Neck: Normal range of motion. Neck supple. No JVD present.  Cardiovascular: Normal rate, regular rhythm and normal heart sounds.   Pulmonary/Chest: Effort normal and breath sounds normal.  Abdominal: Soft. Bowel sounds are normal.  Musculoskeletal: Normal range of motion.  Lymphadenopathy:    She has no cervical adenopathy.  Neurological: She is alert and oriented to person, place, and time. She displays normal reflexes. No cranial nerve deficit or sensory deficit. She exhibits normal muscle tone. Coordination normal.  Skin: Skin is warm and dry. Capillary refill takes less than 2 seconds. No rash noted.  Psychiatric: She has a normal mood and affect. Her behavior is normal.  Vitals reviewed.  Results for orders placed or performed in visit on 09/12/16 (from the past 24 hour(s))  POCT urine pregnancy     Status: None   Collection Time: 09/12/16 12:53 PM  Result Value Ref Range   Preg Test, Ur Negative Negative  POCT urinalysis dipstick     Status: Abnormal   Collection Time: 09/12/16 12:54 PM  Result Value Ref Range   Color, UA yellow yellow   Clarity, UA clear clear   Glucose, UA negative negative mg/dL   Bilirubin, UA negative negative   Ketones, POC UA negative negative mg/dL   Spec Grav, UA 4.098 1.191 - 1.025   Blood, UA moderate (A) negative   pH, UA 7.5 5.0 - 8.0   Protein Ur, POC negative negative mg/dL   Urobilinogen, UA 0.2 0.2 or 1.0 E.U./dL   Nitrite, UA  Negative Negative   Leukocytes, UA Negative Negative  POCT CBC     Status: None   Collection Time: 09/12/16 12:54 PM  Result Value Ref Range   WBC 6.1 4.6 - 10.2 K/uL   Lymph, poc 2.0 0.6 - 3.4   POC LYMPH PERCENT 33.0 10 - 50 %L   MID (cbc) 0.4 0 - 0.9   POC MID % 6.3 0 - 12 %M   POC Granulocyte 3.7 2 - 6.9   Granulocyte percent 60.7 37 - 80 %G   RBC 4.69 4.04 - 5.48 M/uL   Hemoglobin 13.8 12.2 - 16.2 g/dL   HCT, POC 47.8 29.5 - 47.9 %   MCV 86.6 80 - 97 fL   MCH, POC 29.5 27 - 31.2 pg   MCHC 34.0 31.8 - 35.4 g/dL   RDW, POC 62.1 %   Platelet Count, POC 252 142 - 424 K/uL   MPV 7.3 0 - 99.8 fL     ASSESSMENT &  PLAN: Jeanice LimHolly was seen today for dizziness and loss of consciousness.  Diagnoses and all orders for this visit:  General weakness -     POCT urine pregnancy -     POCT urinalysis dipstick -     POCT CBC -     Comprehensive metabolic panel -     HIV antibody -     TSH  Dizziness -     POCT urine pregnancy -     POCT urinalysis dipstick -     POCT CBC -     Comprehensive metabolic panel -     HIV antibody -     TSH    Patient Instructions       IF you received an x-ray today, you will receive an invoice from [facility]. [facility] at [phone] with questions or concerns regarding your invoice.   IF you received labwork today, you will receive an invoice from [facility]. Please contact [facility] at [phone] with questions or concerns regarding your invoice.   Our billing staff will not be able to assist you with questions regarding bills from these companies.  You will be contacted with the lab results as soon as they are available. The fastest way to get your results is to activate your My Chart account. Instructions are located on the last page of this paperwork. If you have not heard from us regarding the results in 2 weeks, please contact this office.     Weakness Weakness is a lack of strength. You may feel  weak all over your body (generalized), or you may feel weak in one specific part of your body (focal). There are many potential causes of weakness. Sometimes, the cause of your weakness may not be known. Some causes of weakness can be serious, so it is important to see your doctor. Follow these instructions at home:  Rest as needed.  Try to get enough sleep. Talk to your doctor about how much sleep you need each night.  Take over-the-counter and prescription medicines only as told by your doctor.  Eat a healthy, well-balanced diet. This includes: ? Proteins to build muscles, such as lean meats and fish. ? Fresh fruits and vegetables. ? Carbohydrates to boost energy, such as whole grains.  Drink enough fluid to keep your pee (urine) clear or pale yellow.  Do strength exercises, such as arm curls and leg raises, for 30 minutes at least 2 days a week or as told by your doctor.  Think about working with a physical therapist or trainer to help you get stronger.  Keep all follow-up visits as told by your doctor. This is important. Contact a doctor if:  Your weakness does not get better or it gets worse.  Your weakness affects your ability to: ? Think clearly. ? Do your normal daily activities. Get help right away if:  You have sudden weakness.  You have trouble breathing or shortness of breath.  You have problems with your vision.  You have trouble talking or swallowing.  You have trouble standing or walking.  You have chest pain.  You are light-headed.  You pass out (lose consciousness). This information is not intended to replace advice given to you by your health care provider. Make sure you discuss any questions you have with your health care provider. Document Released: 12/15/2007 Document Revised: 01/27/2015 Document Reviewed: 10/22/2014 Elsevier Interactive Patient Education  2018 Elsevier Inc.      Edwina BarthMiguel Avry Roedl, MD [facility]

## 2016-09-13 LAB — COMPREHENSIVE METABOLIC PANEL
ALBUMIN: 4.3 g/dL (ref 3.5–5.5)
ALK PHOS: 51 IU/L (ref 39–117)
ALT: 7 IU/L (ref 0–32)
AST: 13 IU/L (ref 0–40)
Albumin/Globulin Ratio: 2 (ref 1.2–2.2)
BUN / CREAT RATIO: 13 (ref 9–23)
BUN: 8 mg/dL (ref 6–20)
Bilirubin Total: 0.3 mg/dL (ref 0.0–1.2)
CO2: 20 mmol/L (ref 20–29)
CREATININE: 0.62 mg/dL (ref 0.57–1.00)
Calcium: 9.2 mg/dL (ref 8.7–10.2)
Chloride: 104 mmol/L (ref 96–106)
GFR calc non Af Amer: 124 mL/min/{1.73_m2} (ref >59)
GFR, EST AFRICAN AMERICAN: 143 mL/min/{1.73_m2} (ref >59)
GLUCOSE: 97 mg/dL (ref 65–99)
Globulin, Total: 2.2 g/dL (ref 1.5–4.5)
Potassium: 4 mmol/L (ref 3.5–5.2)
Sodium: 140 mmol/L (ref 134–144)
TOTAL PROTEIN: 6.5 g/dL (ref 6.0–8.5)

## 2016-09-13 LAB — HIV ANTIBODY (ROUTINE TESTING W REFLEX): HIV SCREEN 4TH GENERATION: NONREACTIVE

## 2016-09-13 LAB — TSH: TSH: 1.07 u[IU]/mL (ref 0.450–4.500)

## 2016-10-12 ENCOUNTER — Ambulatory Visit: Payer: Managed Care, Other (non HMO) | Admitting: Emergency Medicine

## 2017-01-26 IMAGING — CT CT HEAD W/O CM
3 of 4 series · 15 of 47 positions shown, 18 images · non-contrast
Comparison: None.

CLINICAL DATA: Pt reports she had a syncopal episode on [REDACTED]
evening. Fell and hit her head when it occurred. Pt reports ongoing
dizziness and HA since then.

EXAM:
CT HEAD WITHOUT CONTRAST
TECHNIQUE: Contiguous axial images were obtained from the base of the skull
through the vertex without intravenous contrast.

[Series 3: head w/o · axial · non-contrast · 0.40mm/px · z∈[-16,+99]mm · 9 of 29 slices shown, 12 images]
[im 3/29  brain]
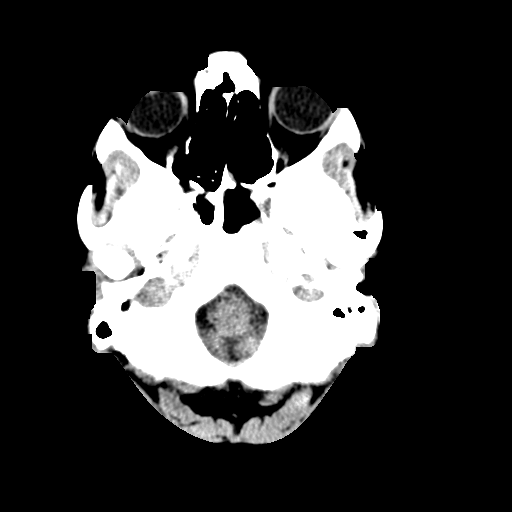
[im 3/29  bone]
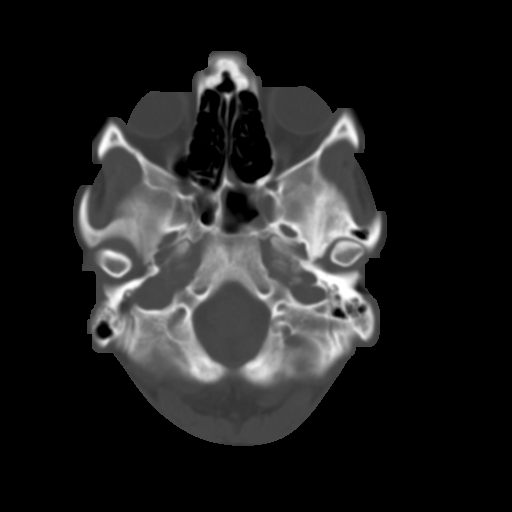
[im 6/29  brain]
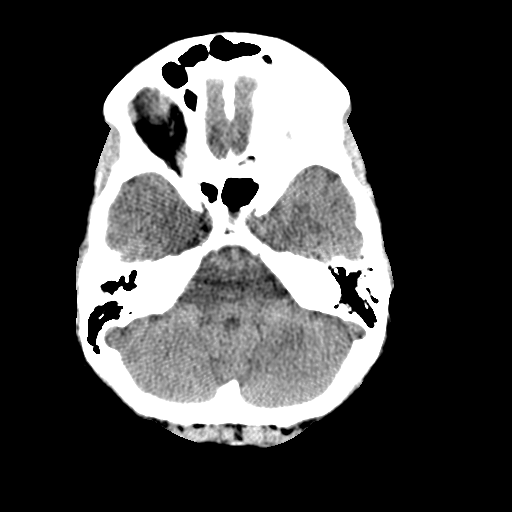
[im 9/29  brain]
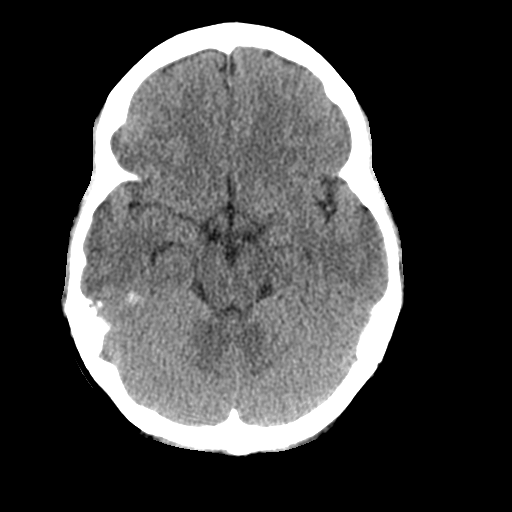
[im 12/29  brain]
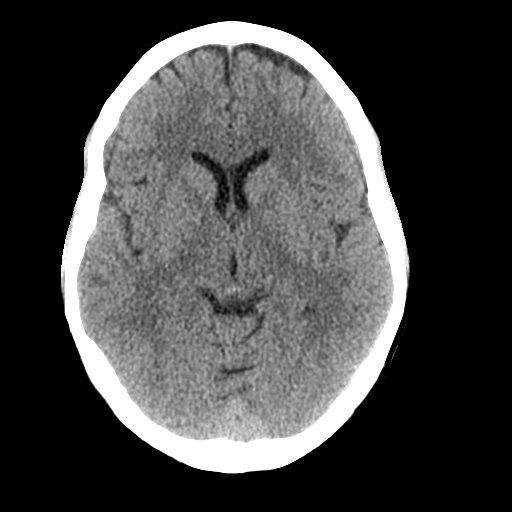
[im 15/29  brain]
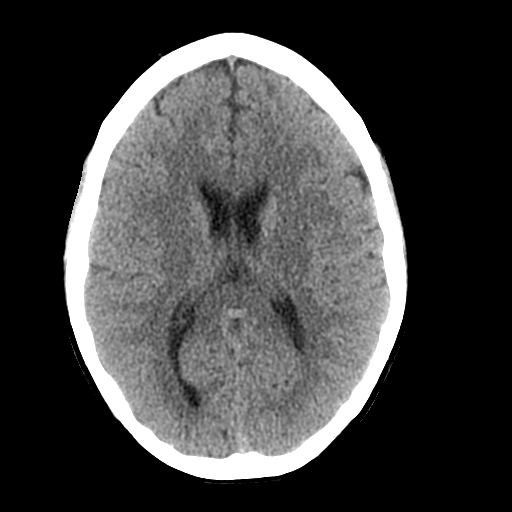
[im 15/29  bone]
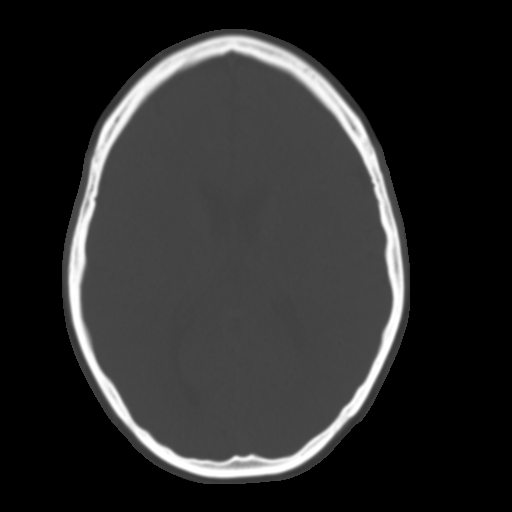
[im 17/29  brain]
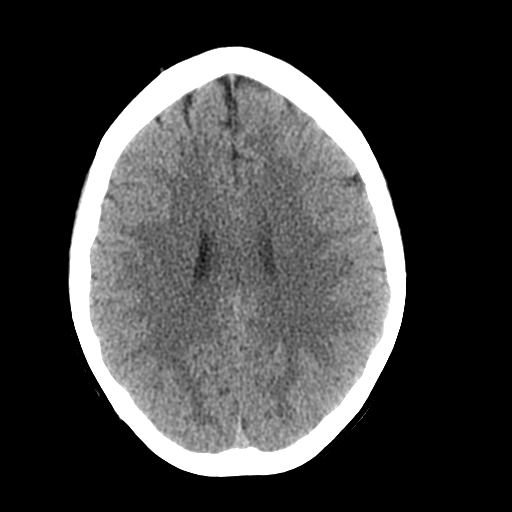
[im 20/29  brain]
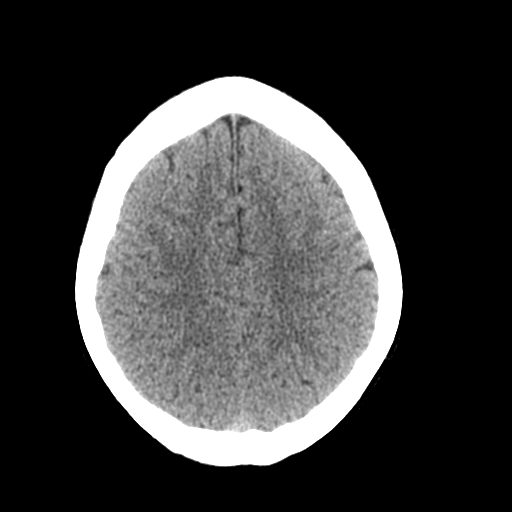
[im 23/29  brain]
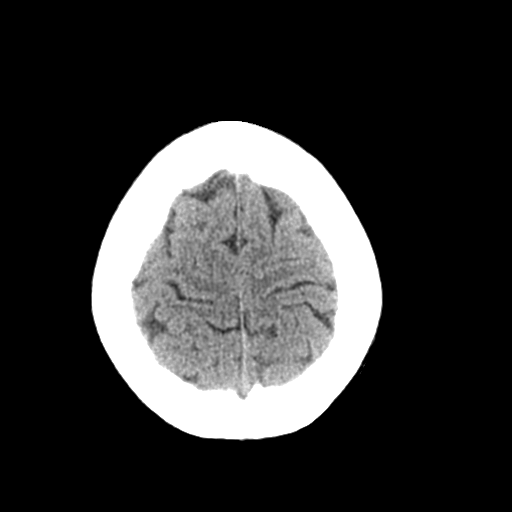
[im 26/29  brain]
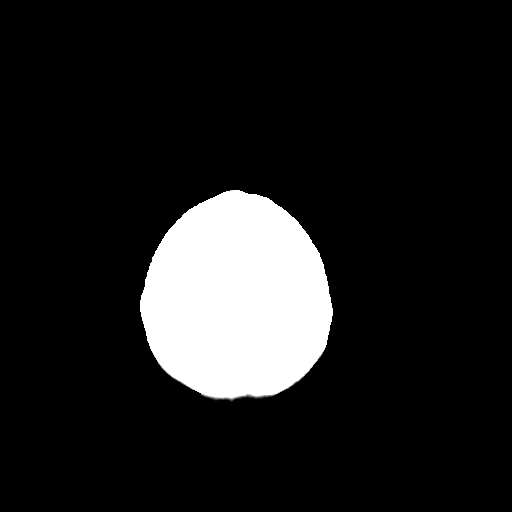
[im 26/29  bone]
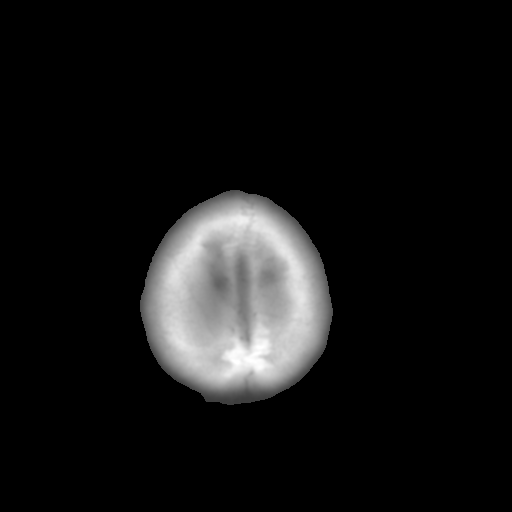

[Series 4: coronal · coronal · 0.28mm/px · 3 of 65 slices shown]
[im 22/65  brain]
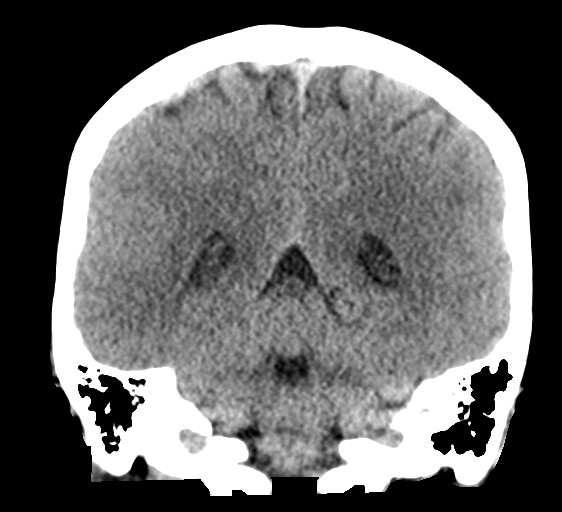
[im 29/65  brain]
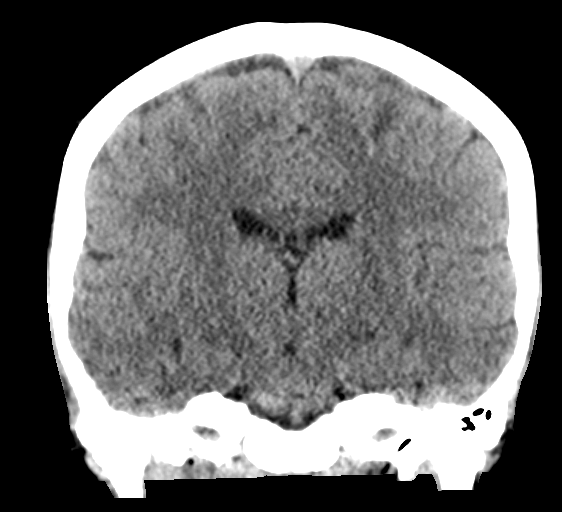
[im 36/65  brain]
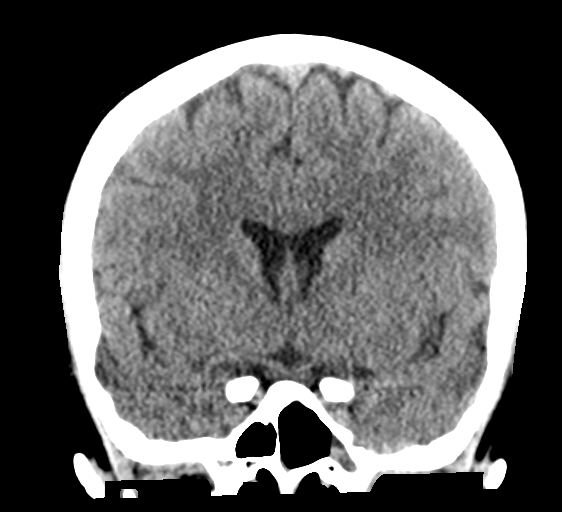

[Series 5: sagittal · sagittal · 0.28mm/px · 3 of 50 slices shown]
[im 17/50  brain]
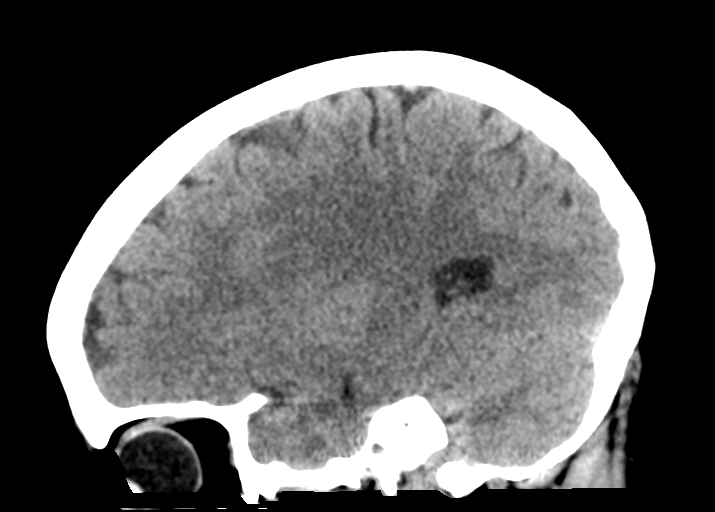
[im 25/50  brain]
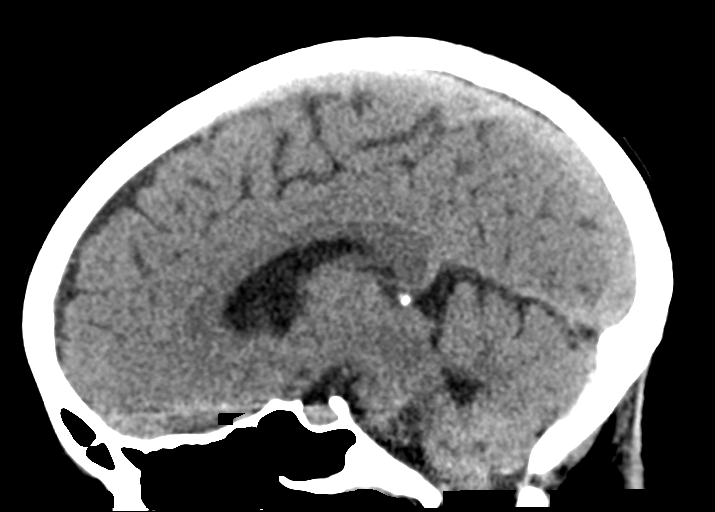
[im 33/50  brain]
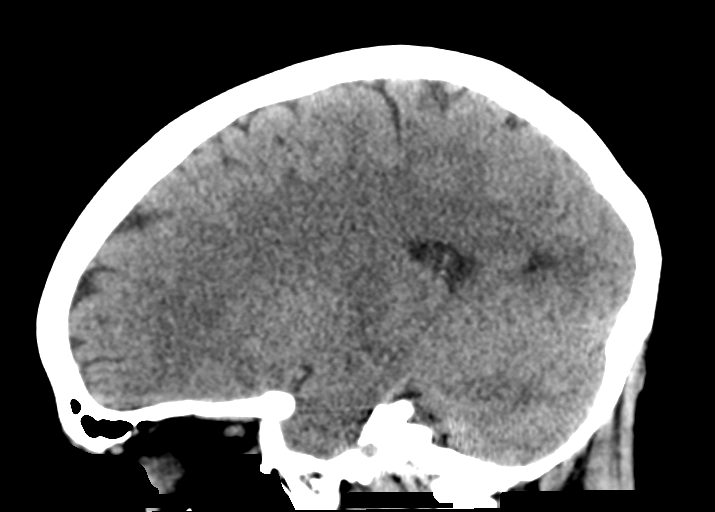

[15 of 47 positions shown; findings below may reference images not displayed]

FINDINGS: Overall the ventricles are normal in size and configuration.

There are no parenchymal masses or mass effect, no evidence of an
infarct, no extra-axial masses or abnormal fluid collections and no
intracranial hemorrhage.

No skull fracture.

Mild mucosal thickening in the left sphenoid sinus. Remaining
visualized sinuses and the mastoid air cells are clear.
IMPRESSION: 1. No intracranial abnormality.
2. No skull fracture.

## 2019-06-10 ENCOUNTER — Other Ambulatory Visit: Payer: Self-pay

## 2019-06-10 ENCOUNTER — Encounter (HOSPITAL_COMMUNITY): Payer: Self-pay

## 2019-06-10 ENCOUNTER — Emergency Department (HOSPITAL_COMMUNITY)
Admission: EM | Admit: 2019-06-10 | Discharge: 2019-06-11 | Disposition: A | Discharge status: Home / Self Care | Payer: Managed Care, Other (non HMO) | Attending: Emergency Medicine | Admitting: Emergency Medicine

## 2019-06-10 DIAGNOSIS — Z79899 Other long term (current) drug therapy: Secondary | ICD-10-CM | POA: Insufficient documentation

## 2019-06-10 DIAGNOSIS — R102 Pelvic and perineal pain: Secondary | ICD-10-CM | POA: Diagnosis not present

## 2019-06-10 DIAGNOSIS — N939 Abnormal uterine and vaginal bleeding, unspecified: Secondary | ICD-10-CM | POA: Diagnosis not present

## 2019-06-10 LAB — URINALYSIS, ROUTINE W REFLEX MICROSCOPIC
Bilirubin Urine: NEGATIVE
Glucose, UA: NEGATIVE mg/dL
Ketones, ur: 5 mg/dL — AB
Leukocytes,Ua: NEGATIVE
Nitrite: NEGATIVE
Protein, ur: NEGATIVE mg/dL
RBC / HPF: >50 RBC/hpf — ABNORMAL HIGH (ref 0–5)
Specific Gravity, Urine: 1.025 (ref 1.005–1.030)
pH: 6 (ref 5.0–8.0)

## 2019-06-10 LAB — CBC
HCT: 41.5 % (ref 36.0–46.0)
Hemoglobin: 13.6 g/dL (ref 12.0–15.0)
MCH: 29.7 pg (ref 26.0–34.0)
MCHC: 32.8 g/dL (ref 30.0–36.0)
MCV: 90.6 fL (ref 80.0–100.0)
Platelets: 303 10*3/uL (ref 150–400)
RBC: 4.58 MIL/uL (ref 3.87–5.11)
RDW: 13.4 % (ref 11.5–15.5)
WBC: 9.1 10*3/uL (ref 4.0–10.5)
nRBC: 0 % (ref 0.0–0.2)

## 2019-06-10 LAB — BASIC METABOLIC PANEL
Anion gap: 8 (ref 5–15)
BUN: 11 mg/dL (ref 6–20)
CO2: 26 mmol/L (ref 22–32)
Calcium: 8.8 mg/dL — ABNORMAL LOW (ref 8.9–10.3)
Chloride: 104 mmol/L (ref 98–111)
Creatinine, Ser: 0.63 mg/dL (ref 0.44–1.00)
GFR calc Af Amer: >60 mL/min (ref >60)
GFR calc non Af Amer: >60 mL/min (ref >60)
Glucose, Bld: 92 mg/dL (ref 70–99)
Potassium: 3.7 mmol/L (ref 3.5–5.1)
Sodium: 138 mmol/L (ref 135–145)

## 2019-06-10 LAB — HCG, QUANTITATIVE, PREGNANCY: hCG, Beta Chain, Quant, S: <1 m[IU]/mL (ref <5)

## 2019-06-10 NOTE — ED Provider Notes (Signed)
[facility]-EMERGENCY DEPT Provider Note   CSN: [id number] Arrival date & time: 06/10/19  1450     History Chief Complaint  Patient presents with  . Vaginal Bleeding    Robin Lambert is a 29 y.o. female.  29 y.o female with a PMH of Stroke, pericarditis presents to the ED with a chief complaint of vaginal bleeding x 1 month.  Patient describes a bright red clots the severe nature for the past month.  Reports this problem has been ongoing.  Has been seen at [facility], walk-in clinic and has had 2 Pap smears done but has not had any results reported to her.  She also endorses pain along the right lower quadrant, states pain is constant, feels like is worsening.  She is changing 1 whole pack of pads daily, states she is unable to wear tampons as she has pain with placing them.  Was given a prescription for ibuprofen 800 mg by [facility] but reports there has been no improvement in her symptoms.  Also endorses fatigue, nausea.  Has had no fevers, not currently sexually active, really on no birth control.  No prior history of irregular bleeding.  The history is provided by the patient.  Vaginal Bleeding Quality:  Bright red and clots Severity:  Severe Onset quality:  Gradual Duration:  1 month Timing:  Constant Progression:  Worsening Number of pads used:  A whole pack in 1 pad Number of tampons used:  Pain with placement.  Context: not during intercourse and not during urination   Worsened by:  Nothing Ineffective treatments:  Acetaminophen Associated symptoms: abdominal pain, fatigue and nausea        Past Medical History:  Diagnosis Date  . Pericarditis   . Stroke [facility])    x2    There are no problems to display for this patient.   Past Surgical History:  Procedure Laterality Date  . TONSILLECTOMY       OB History   No obstetric history on file.     History reviewed. No pertinent family history.  Social History    Tobacco Use  . Smoking status: Never Smoker  . Smokeless tobacco: Never Used  Substance Use Topics  . Alcohol use: Yes    Comment: occ  . Drug use: No    Home Medications Prior to Admission medications   Medication Sig Start Date End Date Taking? Authorizing Provider  albuterol (PROVENTIL HFA;VENTOLIN HFA) 108 (90 BASE) MCG/ACT inhaler Inhale 2 puffs into the lungs every 6 (six) hours as needed for wheezing or shortness of breath.    [provider]  EPINEPHrine (EPIPEN JR) 0.15 MG/0.3ML injection Inject 0.15 mg into the muscle as needed for anaphylaxis.     [provider]  ibuprofen (ADVIL,MOTRIN) 200 MG tablet Take 400 mg by mouth every 4 (four) hours as needed for fever, headache, mild pain, moderate pain or cramping.     [provider]  meclizine (ANTIVERT) 25 MG tablet Take 1 tablet (25 mg total) by mouth 3 (three) times daily as needed for dizziness. Patient not taking: Reported on 09/12/2016 08/11/15   Jaynie Crumble, PA-C  naproxen (NAPROSYN) 500 MG tablet Take 1 tablet (500 mg total) by mouth 2 (two) times daily for 7 days. 06/11/19 06/18/19  Claude Manges, PA-C    Allergies    Peanuts [peanut oil] and Penicillins  Review of Systems   Review of Systems  Constitutional: Positive for fatigue.  Respiratory: Negative for shortness of  breath.   Cardiovascular: Negative for chest pain.  Gastrointestinal: Positive for abdominal pain and nausea.  Genitourinary: Positive for vaginal bleeding.  All other systems reviewed and are negative.   Physical Exam Updated Vital Signs BP 111/72 (BP Location: Left Arm)   Pulse (!) 53   Temp 98.1 F (36.7 C) (Oral)   Resp 15   Ht 5\' 3"  (1.6 m)   Wt 47.6 kg   LMP 05/11/2019   SpO2 96%   BMI 18.60 kg/m   Physical Exam Vitals and nursing note reviewed.  Constitutional:      Appearance: Normal appearance.  HENT:     Head: Normocephalic and atraumatic.     Nose: Nose normal.     Mouth/Throat:      Mouth: Mucous membranes are moist.  Cardiovascular:     Rate and Rhythm: Normal rate.  Pulmonary:     Effort: Pulmonary effort is normal.  Chest:     Chest wall: Tenderness present.  Abdominal:     General: Abdomen is flat.     Tenderness: There is no abdominal tenderness. There is no right CVA tenderness or left CVA tenderness.     Comments: Abdomen is benign.   Musculoskeletal:     Cervical back: Normal range of motion and neck supple.  Skin:    General: Skin is warm and dry.     Coloration: Skin is pale.  Neurological:     Mental Status: She is alert and oriented to person, place, and time.     ED Results / Procedures / Treatments   Labs (all labs ordered are listed, but only abnormal results are displayed) Labs Reviewed  WET PREP, GENITAL - Abnormal; Notable for the following components:      Result Value   WBC, Wet Prep HPF POC MANY (*)    All other components within normal limits  BASIC METABOLIC PANEL - Abnormal; Notable for the following components:   Calcium 8.8 (*)    All other components within normal limits  URINALYSIS, ROUTINE W REFLEX MICROSCOPIC - Abnormal; Notable for the following components:   APPearance HAZY (*)    Hgb urine dipstick LARGE (*)    Ketones, ur 5 (*)    RBC / HPF >50 (*)    Bacteria, UA RARE (*)    All other components within normal limits  CBC  HCG, QUANTITATIVE, PREGNANCY  GC/CHLAMYDIA PROBE AMP ([place]) NOT AT [facility]    EKG None  Radiology [facility] Transvaginal Non-OB  Result Date: 06/11/2019 CLINICAL DATA:  Cervical motion tenderness. Right lower quadrant pain. Vaginal bleeding for 1 month. EXAM: TRANSABDOMINAL AND TRANSVAGINAL ULTRASOUND OF PELVIS DOPPLER ULTRASOUND OF OVARIES TECHNIQUE: Both transabdominal and transvaginal ultrasound examinations of the pelvis were performed. Transabdominal technique was performed for global imaging of the pelvis including uterus, ovaries, adnexal regions, and pelvic cul-de-sac. It was necessary to  proceed with endovaginal exam following the transabdominal exam to visualize the endometrium, ovaries and adnexa. Color and duplex Doppler ultrasound was utilized to evaluate blood flow to the ovaries. COMPARISON:  None. FINDINGS: Uterus Measurements: 7.6 x 3.4 x 4.1 cm = volume: 54 mL. No fibroids or other mass visualized. Endometrium Thickness: 7 mm.  No focal abnormality visualized. Right ovary Measurements: 3.0 x 2.2 x 2.3 cm = volume: 7.9 mL. Normal appearance/no adnexal mass. Left ovary Measurements: 3.3 x 2.9 x 1.8 cm = volume: 8.8 mL. Normal appearance/no adnexal mass. Pulsed Doppler evaluation of both ovaries demonstrates normal low-resistance arterial and venous waveforms.  Other findings Small volume of simple free fluid in the cul-de-sac. IMPRESSION: Unremarkable pelvic ultrasound.  Normal blood flow to both ovaries. Electronically Signed   By: Narda Rutherford M.D.   On: 06/11/2019 01:31   US Pelvis Complete  Result Date: 06/11/2019 CLINICAL DATA:  Cervical motion tenderness. Right lower quadrant pain. Vaginal bleeding for 1 month. EXAM: TRANSABDOMINAL AND TRANSVAGINAL ULTRASOUND OF PELVIS DOPPLER ULTRASOUND OF OVARIES TECHNIQUE: Both transabdominal and transvaginal ultrasound examinations of the pelvis were performed. Transabdominal technique was performed for global imaging of the pelvis including uterus, ovaries, adnexal regions, and pelvic cul-de-sac. It was necessary to proceed with endovaginal exam following the transabdominal exam to visualize the endometrium, ovaries and adnexa. Color and duplex Doppler ultrasound was utilized to evaluate blood flow to the ovaries. COMPARISON:  None. FINDINGS: Uterus Measurements: 7.6 x 3.4 x 4.1 cm = volume: 54 mL. No fibroids or other mass visualized. Endometrium Thickness: 7 mm.  No focal abnormality visualized. Right ovary Measurements: 3.0 x 2.2 x 2.3 cm = volume: 7.9 mL. Normal appearance/no adnexal mass. Left ovary Measurements: 3.3 x 2.9 x 1.8 cm =  volume: 8.8 mL. Normal appearance/no adnexal mass. Pulsed Doppler evaluation of both ovaries demonstrates normal low-resistance arterial and venous waveforms. Other findings Small volume of simple free fluid in the cul-de-sac. IMPRESSION: Unremarkable pelvic ultrasound.  Normal blood flow to both ovaries. Electronically Signed   By: Narda Rutherford M.D.   On: 06/11/2019 01:31   [place] Art/Ven Flow Abd Pelv Doppler  Result Date: 06/11/2019 CLINICAL DATA:  Cervical motion tenderness. Right lower quadrant pain. Vaginal bleeding for 1 month. EXAM: TRANSABDOMINAL AND TRANSVAGINAL ULTRASOUND OF PELVIS DOPPLER ULTRASOUND OF OVARIES TECHNIQUE: Both transabdominal and transvaginal ultrasound examinations of the pelvis were performed. Transabdominal technique was performed for global imaging of the pelvis including uterus, ovaries, adnexal regions, and pelvic cul-de-sac. It was necessary to proceed with endovaginal exam following the transabdominal exam to visualize the endometrium, ovaries and adnexa. Color and duplex Doppler ultrasound was utilized to evaluate blood flow to the ovaries. COMPARISON:  None. FINDINGS: Uterus Measurements: 7.6 x 3.4 x 4.1 cm = volume: 54 mL. No fibroids or other mass visualized. Endometrium Thickness: 7 mm.  No focal abnormality visualized. Right ovary Measurements: 3.0 x 2.2 x 2.3 cm = volume: 7.9 mL. Normal appearance/no adnexal mass. Left ovary Measurements: 3.3 x 2.9 x 1.8 cm = volume: 8.8 mL. Normal appearance/no adnexal mass. Pulsed Doppler evaluation of both ovaries demonstrates normal low-resistance arterial and venous waveforms. Other findings Small volume of simple free fluid in the cul-de-sac. IMPRESSION: Unremarkable pelvic ultrasound.  Normal blood flow to both ovaries. Electronically Signed   By: Narda Rutherford M.D.   On: 06/11/2019 01:31    Procedures Procedures (including critical care time)  Medications Ordered in ED Medications  naproxen (NAPROSYN) tablet 500 mg  (has no administration in time range)    ED Course  I have reviewed the triage vital signs and the nursing notes.  Pertinent labs & imaging results that were available during my care of the patient were reviewed by me and considered in my medical decision making (see chart for details).  Clinical Course as of Jun 11 147  Thu Jun 11, 2019  0146 WBC, Wet Prep HPF POC(!): MANY [JS]    Clinical Course User Index [JS] Claude Manges, PA-C   MDM Rules/Calculators/A&P   Patient with a past medical history of stroke, pericarditis presents to the ED with complaints of vaginal bleeding for the past month.  Also endorses right lower abdominal pain which has been ongoing, was seen at [facility], walk-in clinic provided with a Pap smear along with ibuprofen 800 mg prescription for pain control without improvement in her symptoms.  Reports she is currently not sexually active as she has had increase in vaginal bleeding, is unsure if this is her period or likely spotting.  Reports there has been episodes where bleeding improves and she has only spotting.  No prior history of a UB, does not have any OB/GYN that she currently follows up with.  Not on any OCPs.  No prior history of bleeding disorder.  During evaluation patient appears pale, blood pressures are soft, no tachycardia.  Abdomen is benign without any tenderness to palpation on my exam.  No CVA tenderness, denies any urinary symptoms.  No swelling to her legs.  We discussed pelvic exam patient has agreed.  AUB  Pelvis US showed: Unremarkable pelvic ultrasound. Normal blood flow to both ovaries.  I discussed the results of ultrasound with patient.  We discussed appropriate follow-up with gynecology, she is agreeable of this at this time.  She will go home on a short course of naproxen in order to help with her lower abdominal pain.  Prep was remarkable for white blood cell count but no clue cells or trichomonas.  GC will return within the  next 48 hours.  Patient shows no recent management, return precautions discussed at length.    Portions of this note were generated with [employer]. Dictation errors may occur despite best attempts at proofreading.  Final Clinical Impression(s) / ED Diagnoses Final diagnoses:  Vaginal bleeding    Rx / DC Orders ED Discharge Orders         Ordered    naproxen (NAPROSYN) 500 MG tablet  2 times daily     06/11/19 0147           Janeece Fitting, PA-C 06/11/19 0149    Gareth Morgan, MD 06/12/19 [phone]

## 2019-06-10 NOTE — ED Triage Notes (Signed)
Patient c/o heavy vaginal bleeding with clots x 1 month. Patient also c/o RLQ pain and nausea x 1 month.

## 2019-06-11 ENCOUNTER — Emergency Department (HOSPITAL_COMMUNITY): Payer: Managed Care, Other (non HMO)

## 2019-06-11 LAB — WET PREP, GENITAL
Clue Cells Wet Prep HPF POC: NONE SEEN
Sperm: NONE SEEN
Trich, Wet Prep: NONE SEEN
Yeast Wet Prep HPF POC: NONE SEEN

## 2019-06-11 MED ORDER — NAPROXEN 500 MG PO TABS
500.0000 mg | ORAL_TABLET | Freq: Two times a day (BID) | ORAL | 0 refills | Status: AC
Start: 2019-06-11 — End: 2019-06-18

## 2019-06-11 MED ORDER — NAPROXEN 500 MG PO TABS
500.0000 mg | ORAL_TABLET | Freq: Once | ORAL | Status: AC
  Administered 2019-06-11: 500 mg via ORAL
  Filled 2019-06-11: qty 1

## 2019-06-11 NOTE — Discharge Instructions (Addendum)
Your laboratory results are within normal limits today.  I provided a copy of your ultrasound, you will need to follow-up with a gynecologist in order to further manage your abnormal uterine bleeding.  I have also prescribed a short prescription of anti-inflammatories, you may take this medication, 1 tablet twice a day for the next 7 days.

## 2019-06-12 LAB — GC/CHLAMYDIA PROBE AMP (~~LOC~~) NOT AT ARMC
Chlamydia: NEGATIVE
Comment: NEGATIVE
Comment: NORMAL
Neisseria Gonorrhea: NEGATIVE

## 2020-11-26 IMAGING — US US ART/VEN ABD/PELV/SCROTUM DOPPLER LTD
1 series · 13 of 25 positions shown · non-contrast
Comparison: None.

CLINICAL DATA: Cervical motion tenderness. Right lower quadrant
pain. Vaginal bleeding for 1 month.

EXAM:
TRANSABDOMINAL AND TRANSVAGINAL ULTRASOUND OF PELVIS
DOPPLER ULTRASOUND OF OVARIES
TECHNIQUE: Both transabdominal and transvaginal ultrasound examinations of the
pelvis were performed. Transabdominal technique was performed for
global imaging of the pelvis including uterus, ovaries, adnexal
regions, and pelvic cul-de-sac.
It was necessary to proceed with endovaginal exam following the
transabdominal exam to visualize the endometrium, ovaries and
adnexa. Color and duplex Doppler ultrasound was utilized to evaluate
blood flow to the ovaries.

[Series 1: us art/ven abd/pelv/scrotum doppler ltd · 13 of 68 slices shown]
[im 1/68]
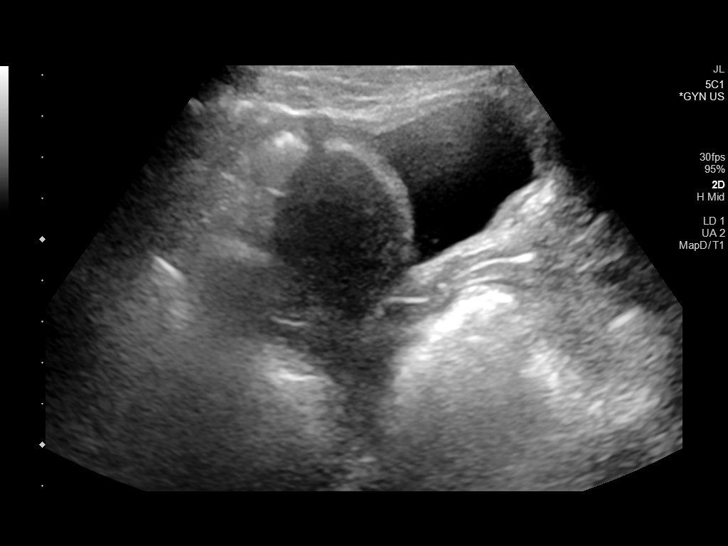
[im 6/68]
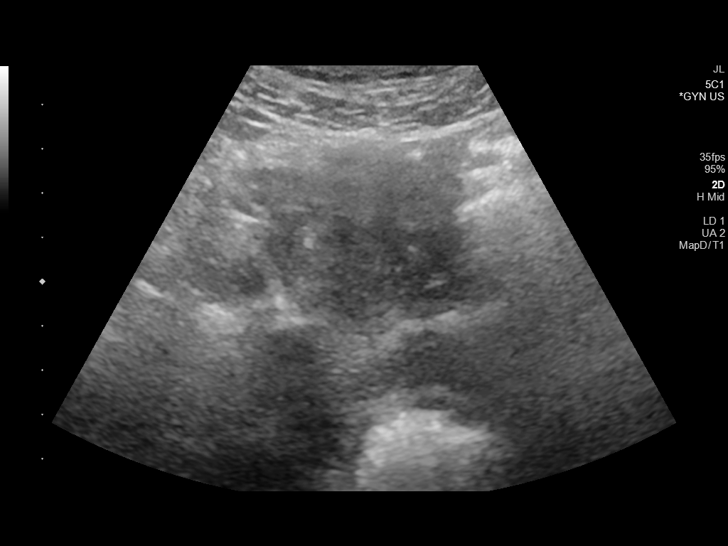
[im 12/68]
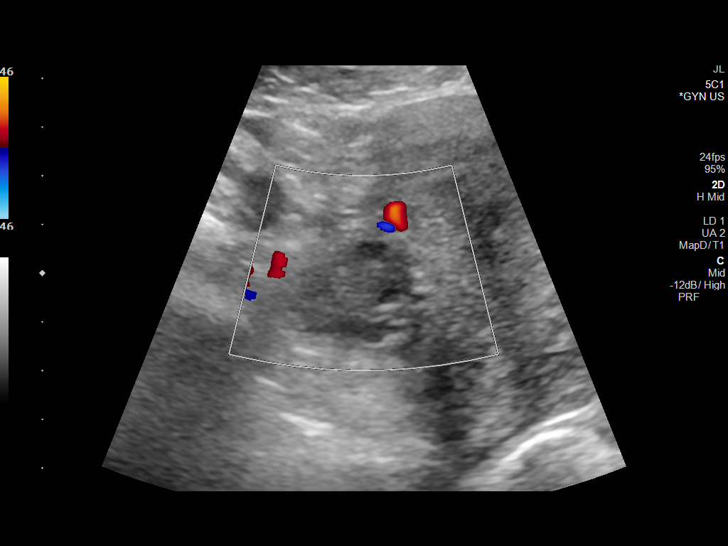
[im 17/68]
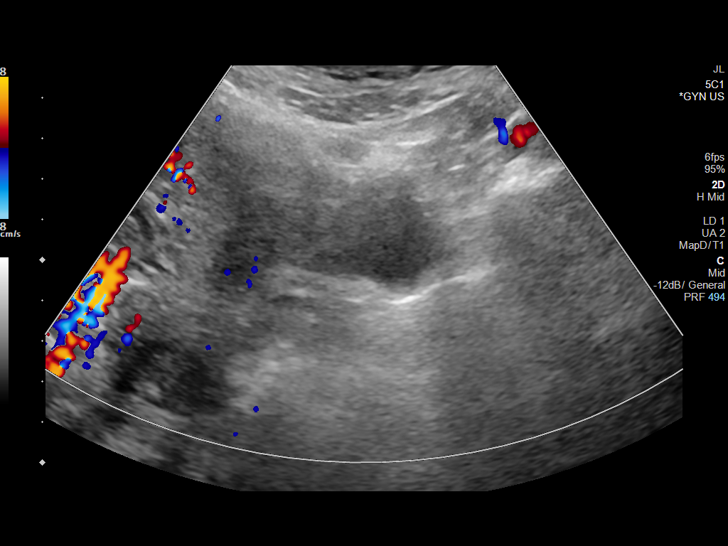
[im 23/68]
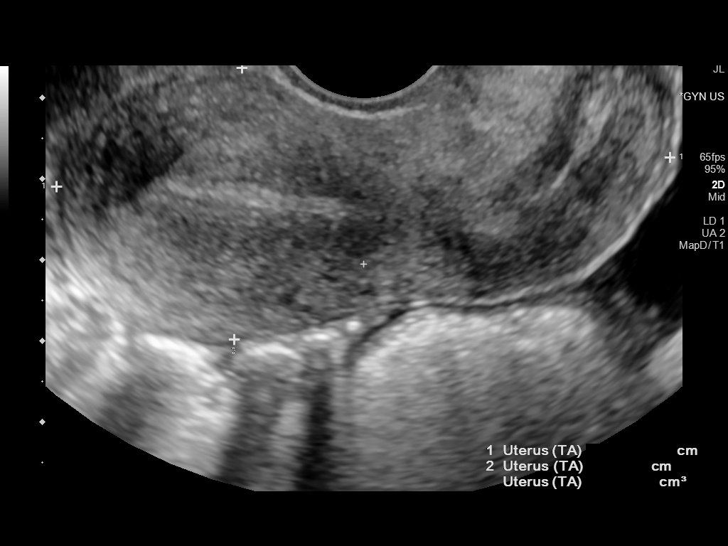
[im 28/68]
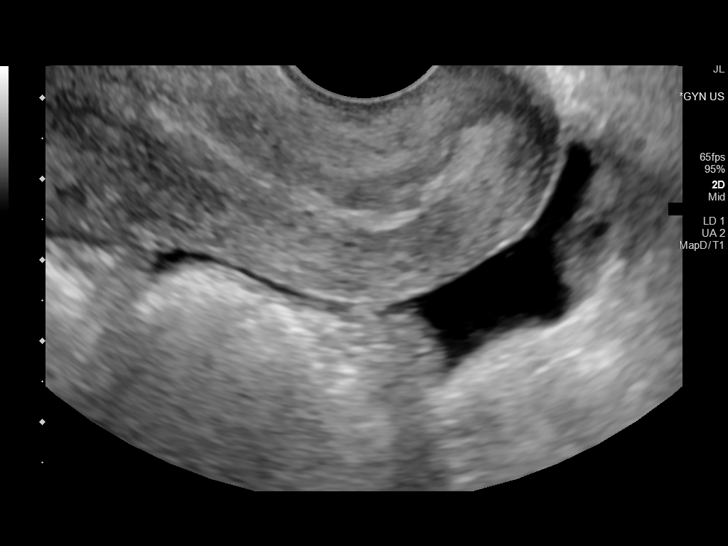
[im 34/68]
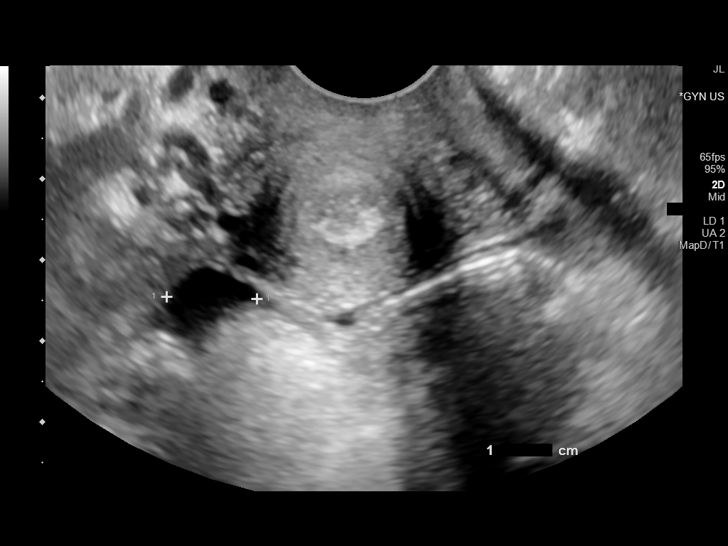
[im 40/68]
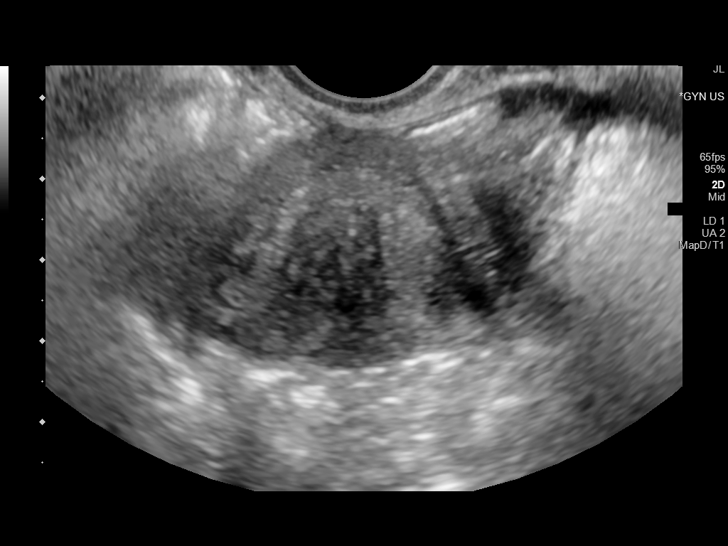
[im 45/68]
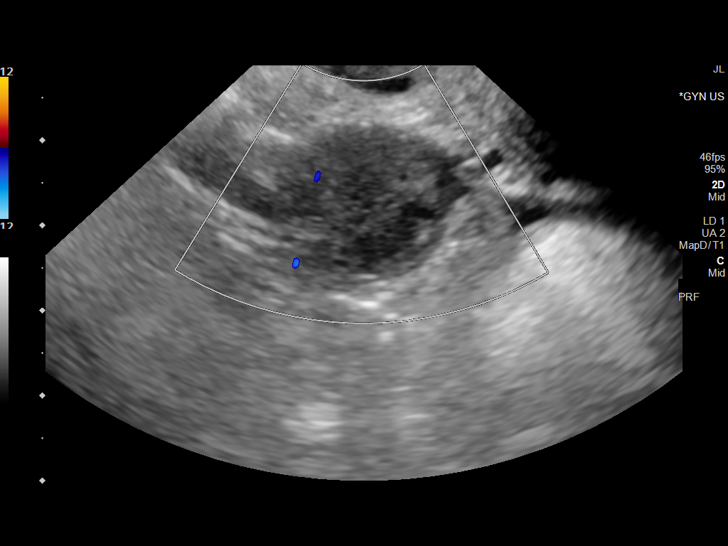
[im 51/68]
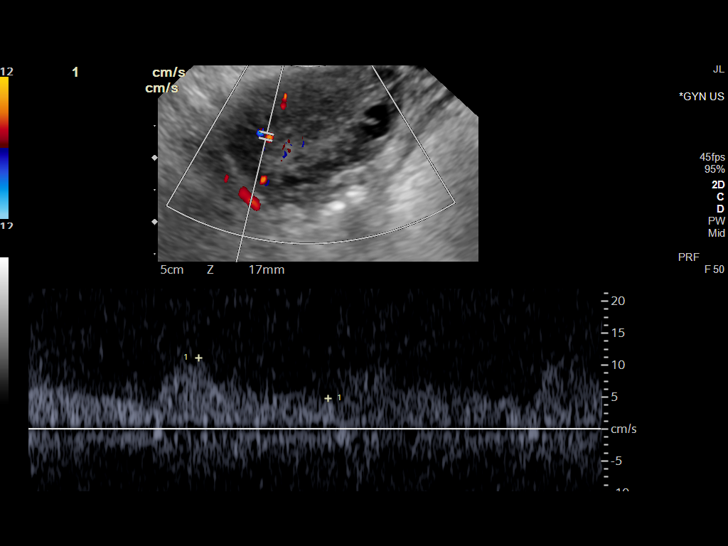
[im 56/68]
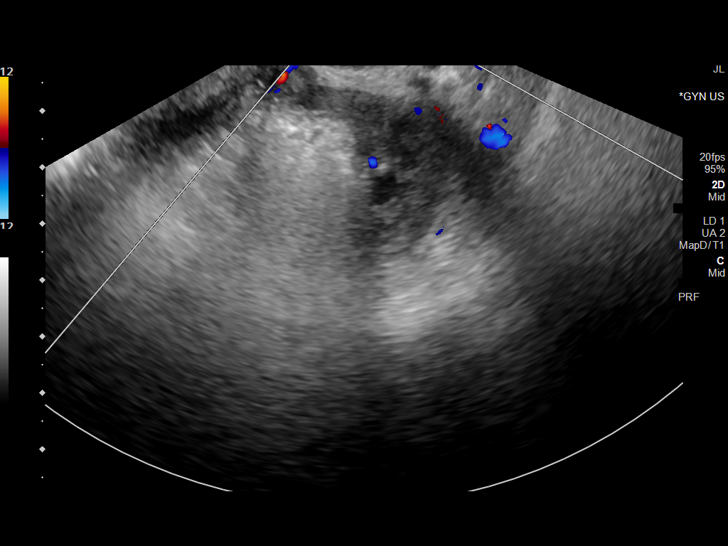
[im 62/68]
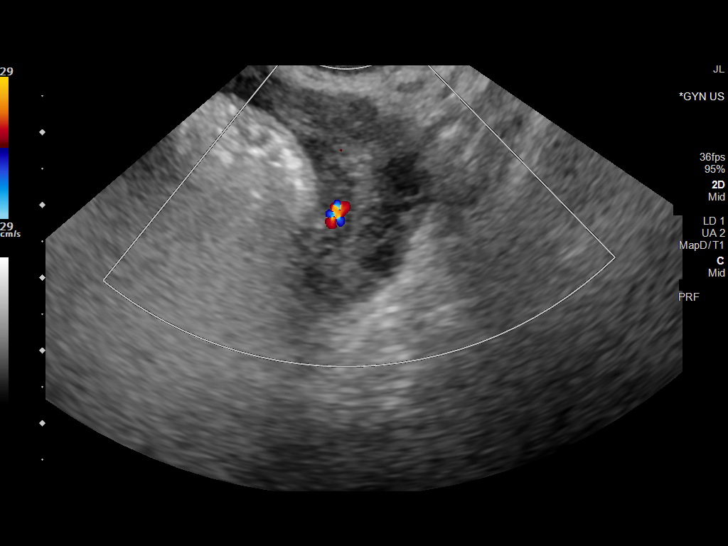
[im 68/68]
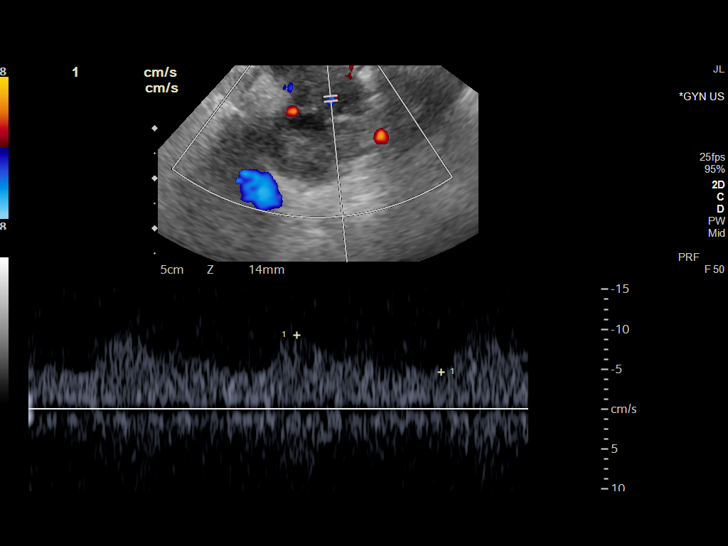

[13 of 25 positions shown; findings below may reference images not displayed]

FINDINGS: Uterus

Measurements: 7.6 x 3.4 x 4.1 cm = volume: 54 mL. No fibroids or
other mass visualized.

Endometrium

Thickness: 7 mm.  No focal abnormality visualized.

Right ovary

Measurements: 3.0 x 2.2 x 2.3 cm = volume: 7.9 mL. Normal
appearance/no adnexal mass.

Left ovary

Measurements: 3.3 x 2.9 x 1.8 cm = volume: 8.8 mL. Normal
appearance/no adnexal mass.

Pulsed Doppler evaluation of both ovaries demonstrates normal
low-resistance arterial and venous waveforms.

Other findings

Small volume of simple free fluid in the cul-de-sac.
IMPRESSION: Unremarkable pelvic ultrasound.  Normal blood flow to both ovaries.

## 2020-11-26 IMAGING — US US PELVIS COMPLETE
1 series · 13 of 25 positions shown · non-contrast
Comparison: None.

CLINICAL DATA: Cervical motion tenderness. Right lower quadrant
pain. Vaginal bleeding for 1 month.

EXAM:
TRANSABDOMINAL AND TRANSVAGINAL ULTRASOUND OF PELVIS
DOPPLER ULTRASOUND OF OVARIES
TECHNIQUE: Both transabdominal and transvaginal ultrasound examinations of the
pelvis were performed. Transabdominal technique was performed for
global imaging of the pelvis including uterus, ovaries, adnexal
regions, and pelvic cul-de-sac.
It was necessary to proceed with endovaginal exam following the
transabdominal exam to visualize the endometrium, ovaries and
adnexa. Color and duplex Doppler ultrasound was utilized to evaluate
blood flow to the ovaries.

[Series 1: us pelvis complete · 13 of 68 slices shown]
[im 1/68]
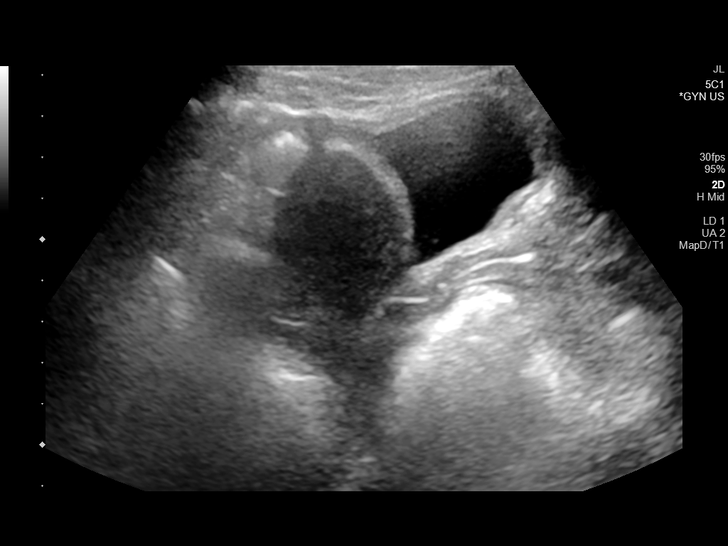
[im 6/68]
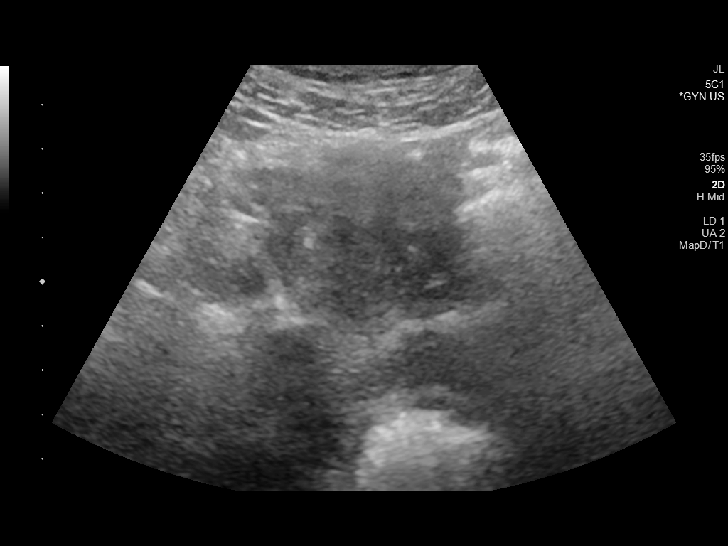
[im 12/68]
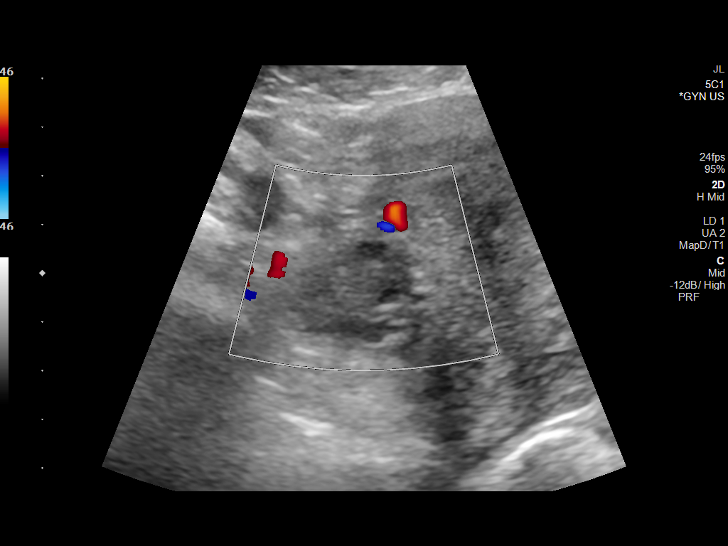
[im 17/68]
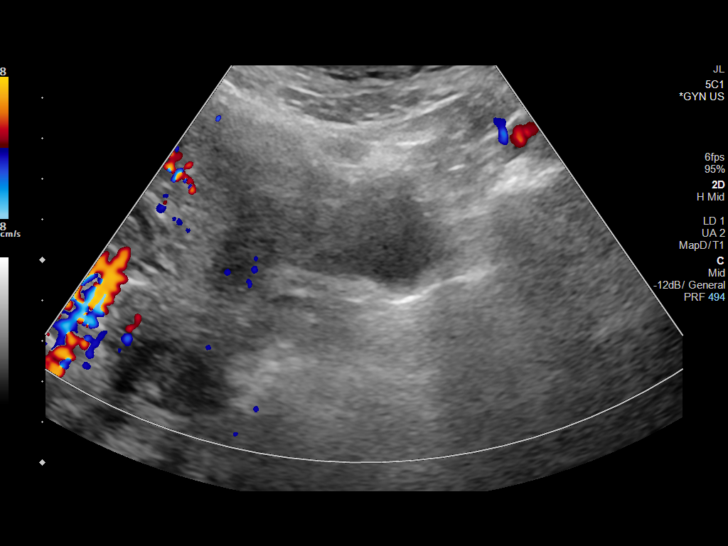
[im 23/68]
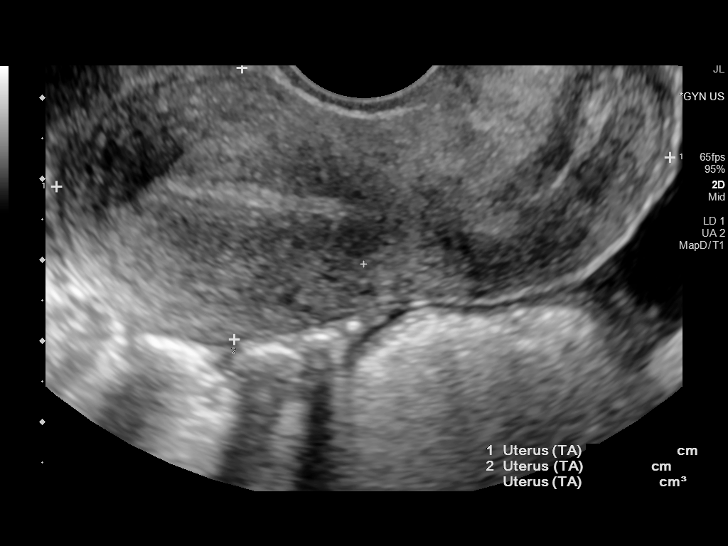
[im 28/68]
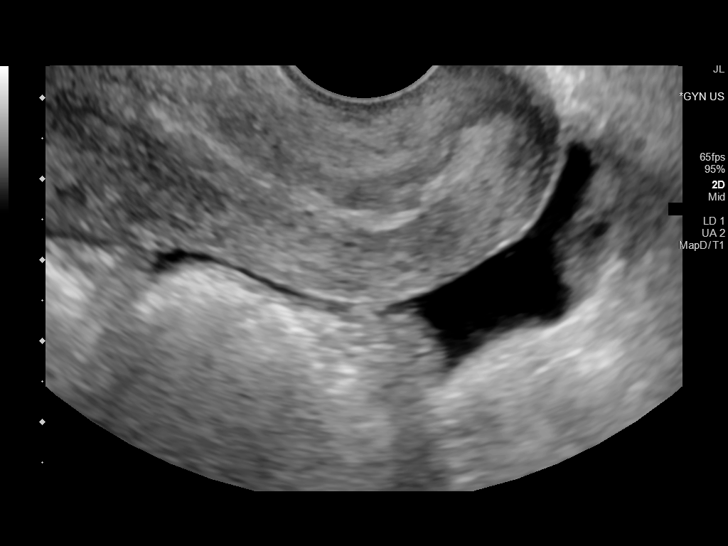
[im 34/68]
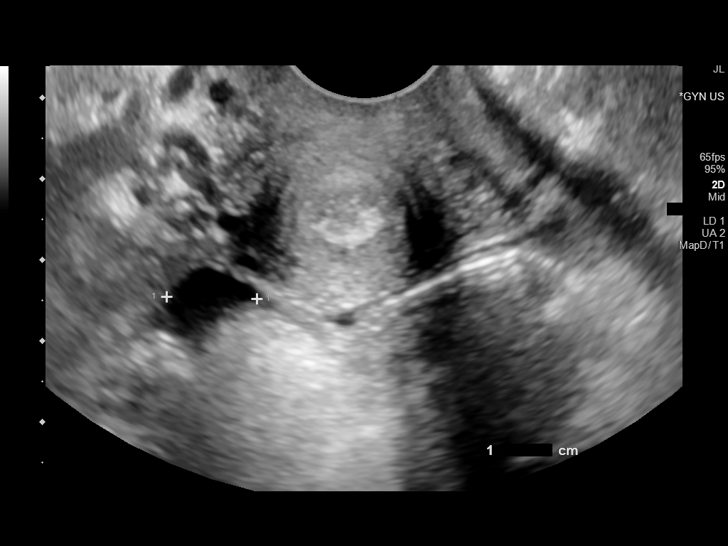
[im 40/68]
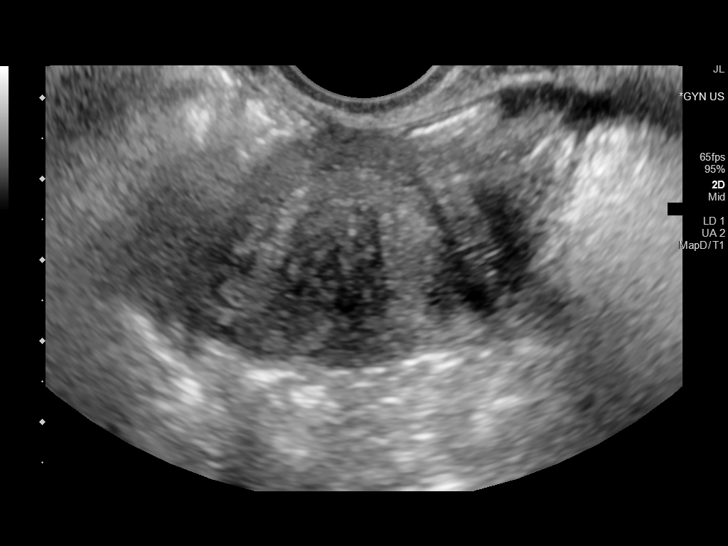
[im 45/68]
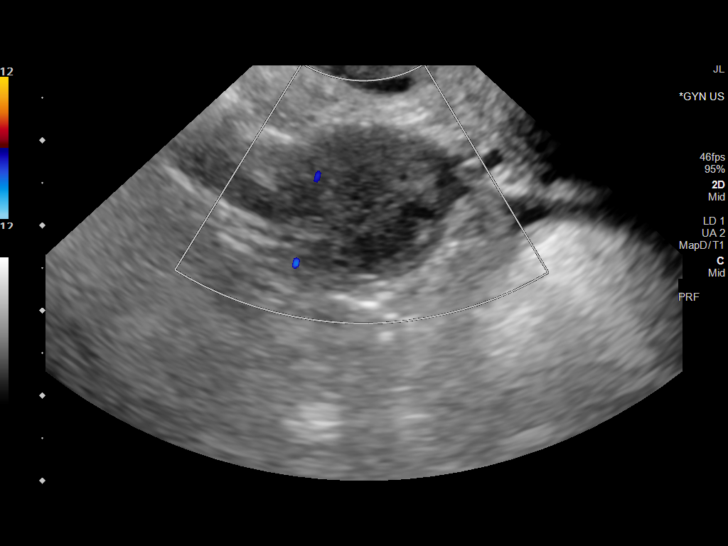
[im 51/68]
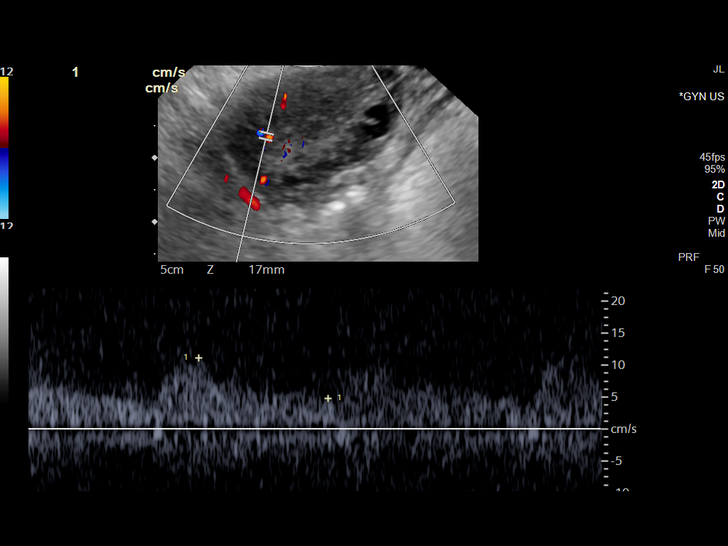
[im 56/68]
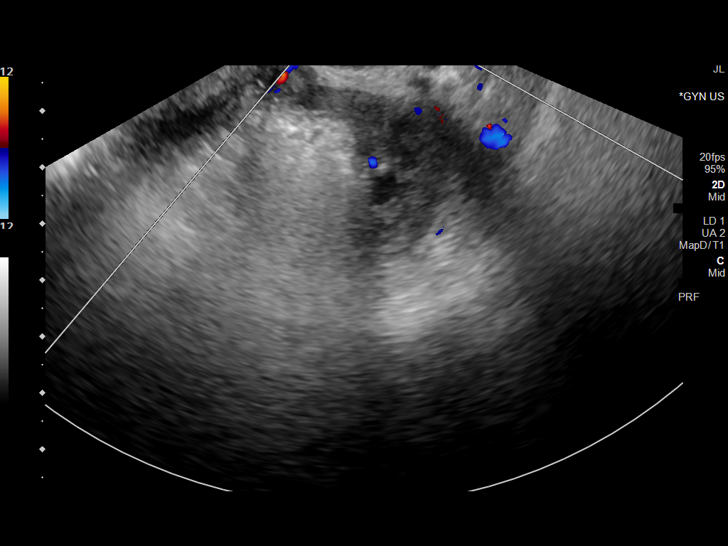
[im 62/68]
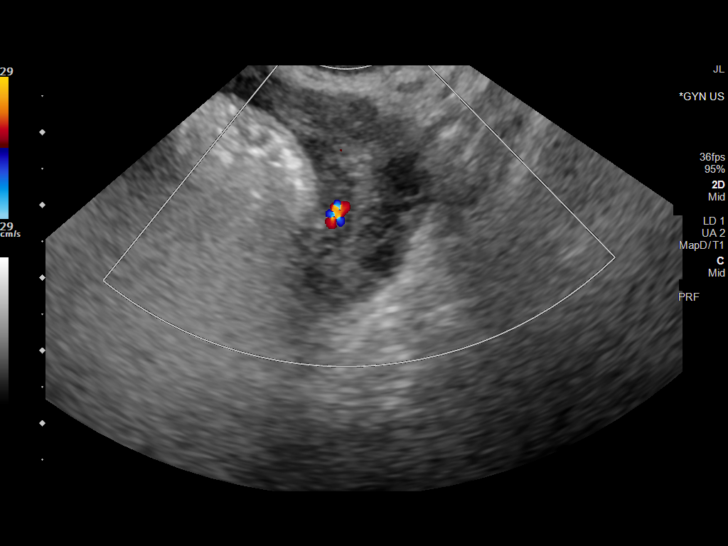
[im 68/68]
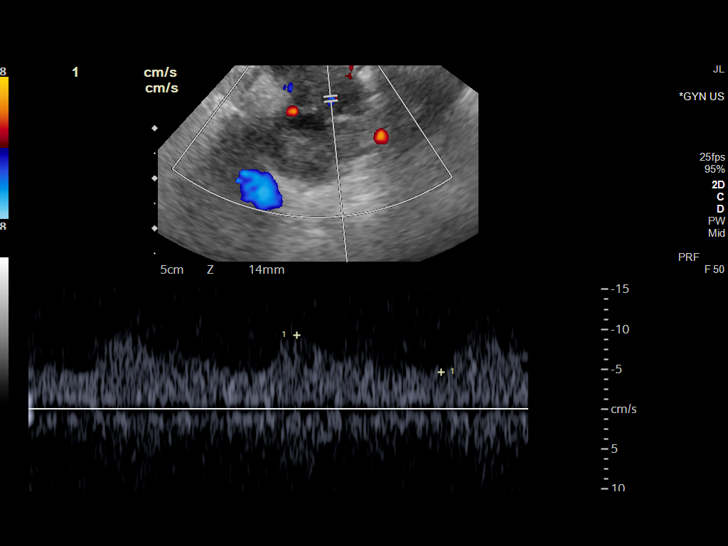

[13 of 25 positions shown; findings below may reference images not displayed]

FINDINGS: Uterus

Measurements: 7.6 x 3.4 x 4.1 cm = volume: 54 mL. No fibroids or
other mass visualized.

Endometrium

Thickness: 7 mm.  No focal abnormality visualized.

Right ovary

Measurements: 3.0 x 2.2 x 2.3 cm = volume: 7.9 mL. Normal
appearance/no adnexal mass.

Left ovary

Measurements: 3.3 x 2.9 x 1.8 cm = volume: 8.8 mL. Normal
appearance/no adnexal mass.

Pulsed Doppler evaluation of both ovaries demonstrates normal
low-resistance arterial and venous waveforms.

Other findings

Small volume of simple free fluid in the cul-de-sac.
IMPRESSION: Unremarkable pelvic ultrasound.  Normal blood flow to both ovaries.

## 2020-11-26 IMAGING — US US TRANSVAGINAL NON-OB
1 series · 13 of 25 positions shown · non-contrast
Comparison: None.

CLINICAL DATA: Cervical motion tenderness. Right lower quadrant
pain. Vaginal bleeding for 1 month.

EXAM:
TRANSABDOMINAL AND TRANSVAGINAL ULTRASOUND OF PELVIS
DOPPLER ULTRASOUND OF OVARIES
TECHNIQUE: Both transabdominal and transvaginal ultrasound examinations of the
pelvis were performed. Transabdominal technique was performed for
global imaging of the pelvis including uterus, ovaries, adnexal
regions, and pelvic cul-de-sac.
It was necessary to proceed with endovaginal exam following the
transabdominal exam to visualize the endometrium, ovaries and
adnexa. Color and duplex Doppler ultrasound was utilized to evaluate
blood flow to the ovaries.

[Series 1: us transvaginal non-ob · 13 of 68 slices shown]
[im 1/68]
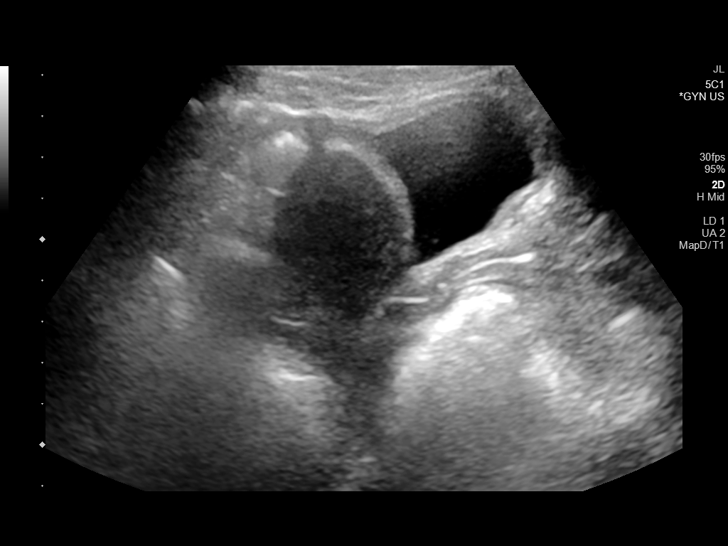
[im 6/68]
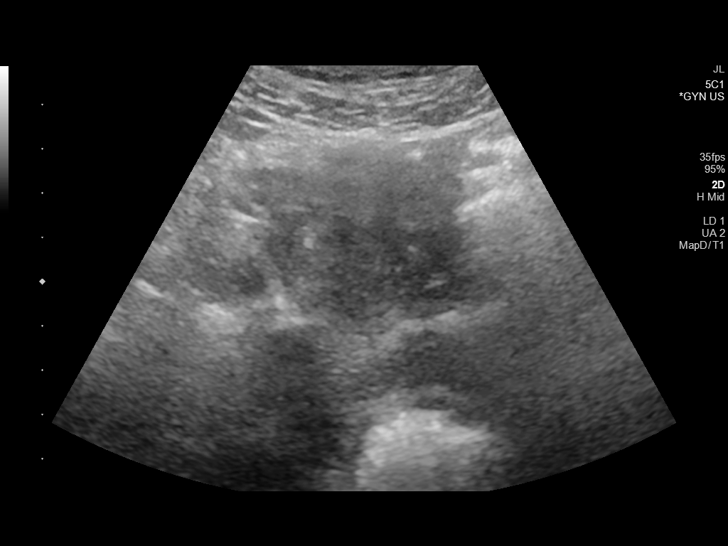
[im 12/68]
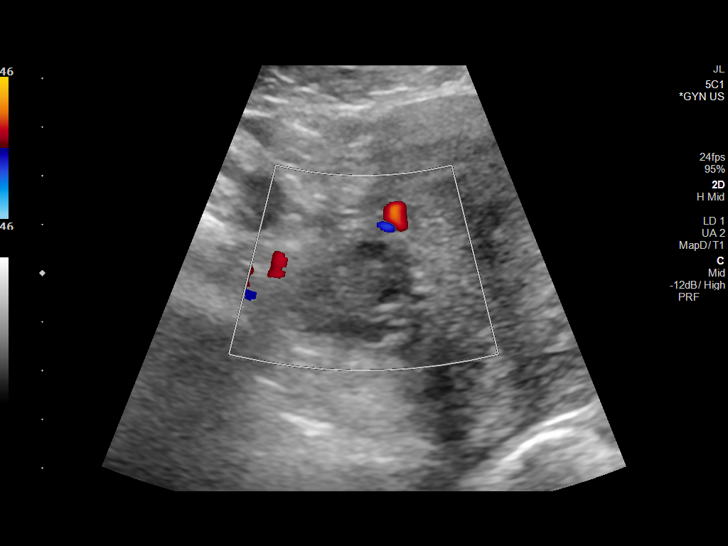
[im 17/68]
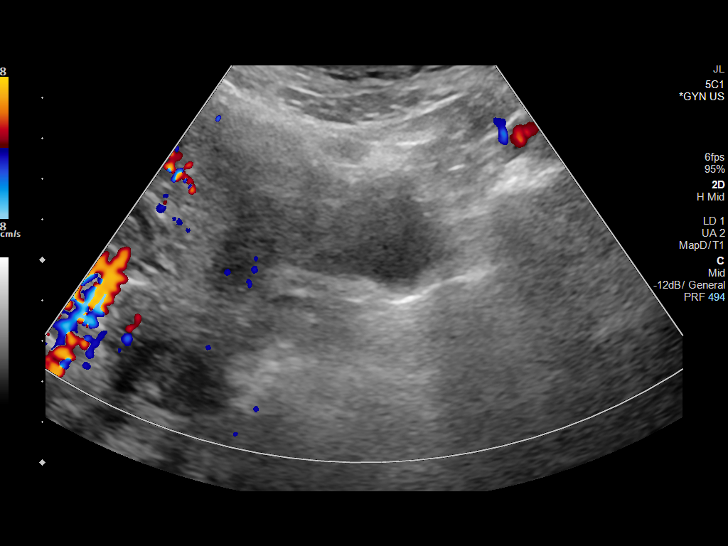
[im 23/68]
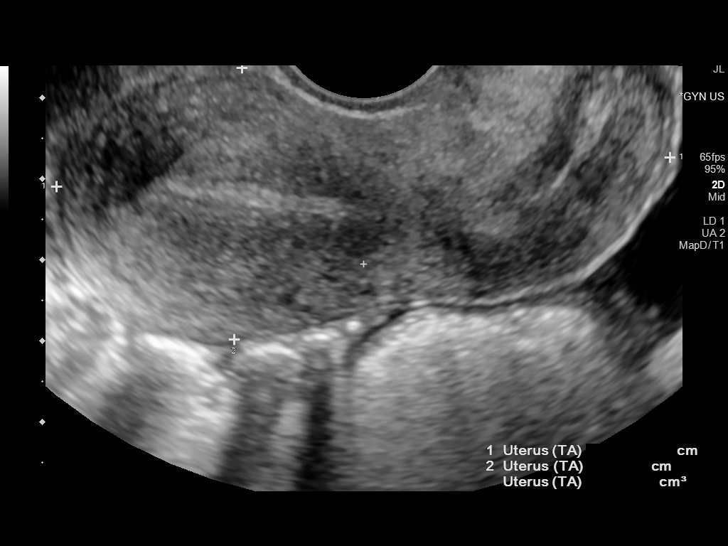
[im 28/68]
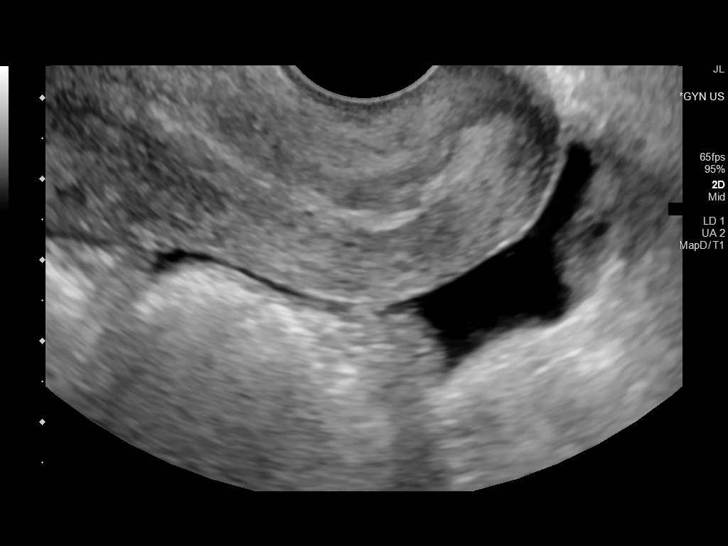
[im 34/68]
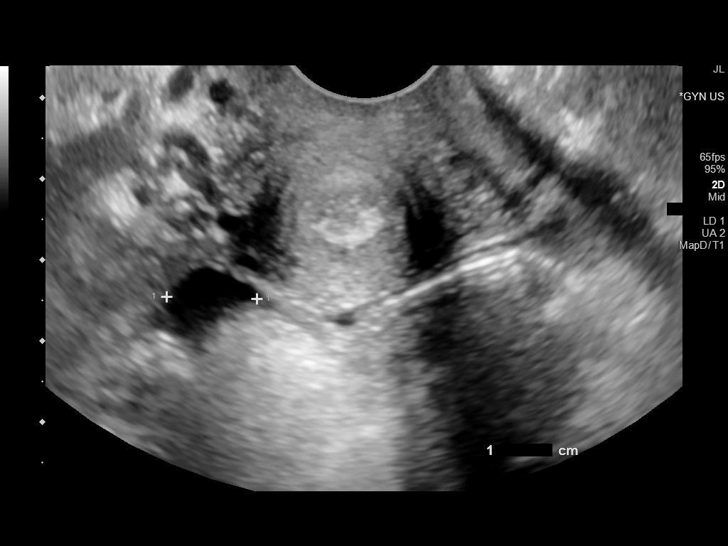
[im 40/68]
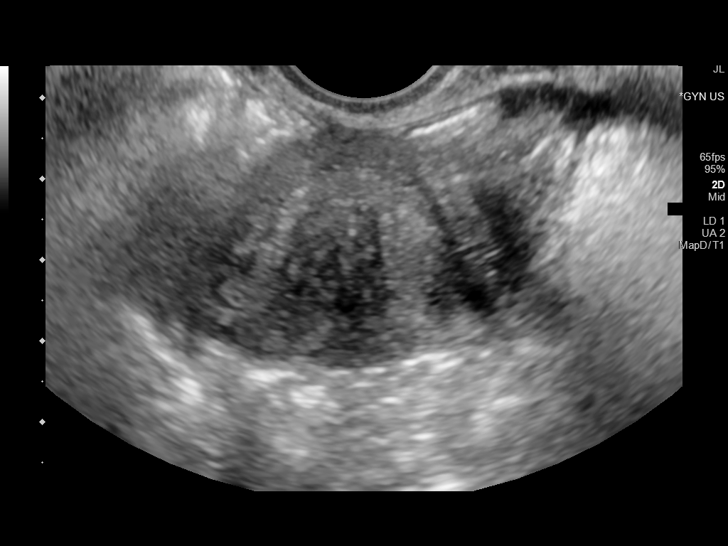
[im 45/68]
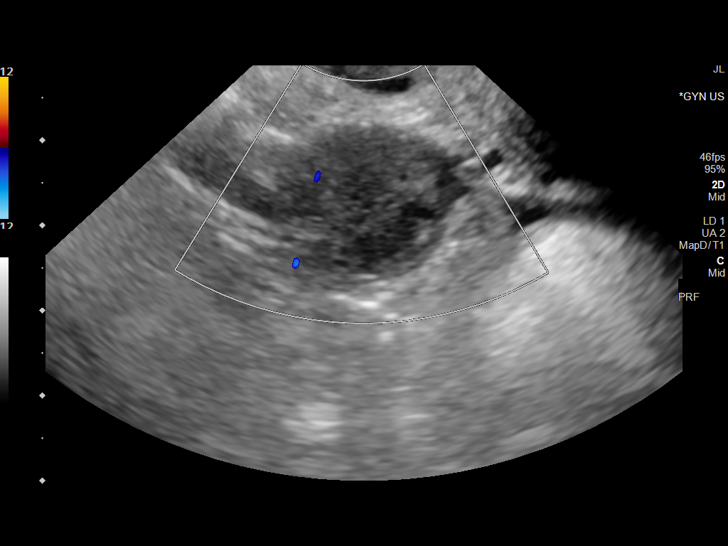
[im 51/68]
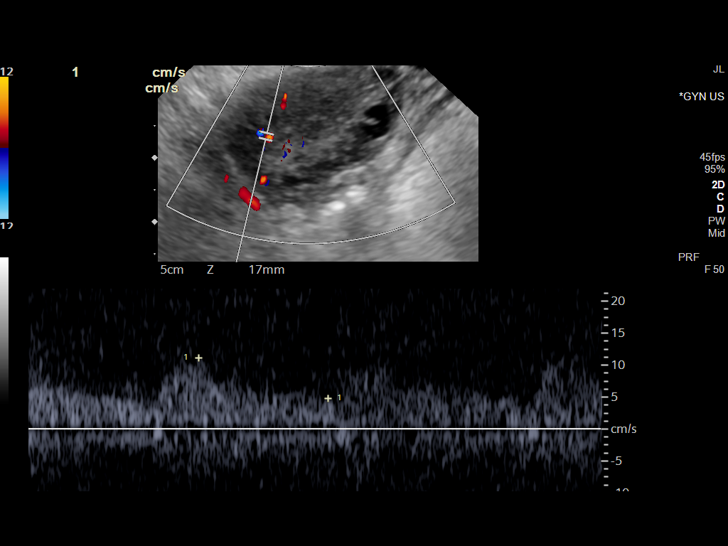
[im 56/68]
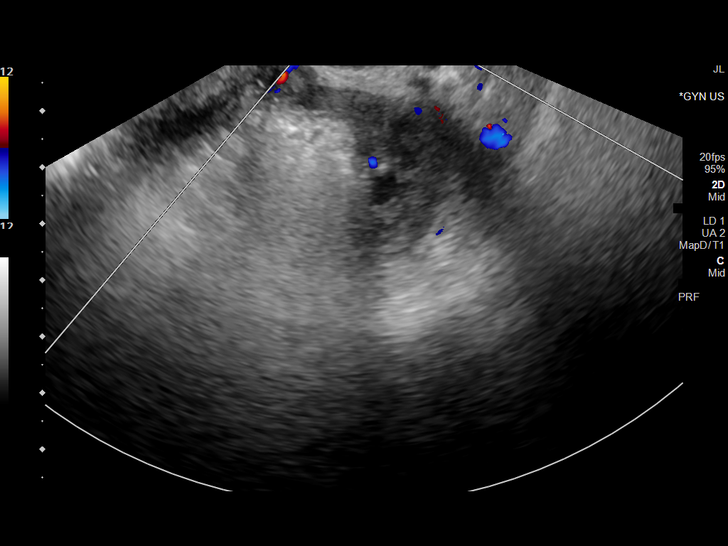
[im 62/68]
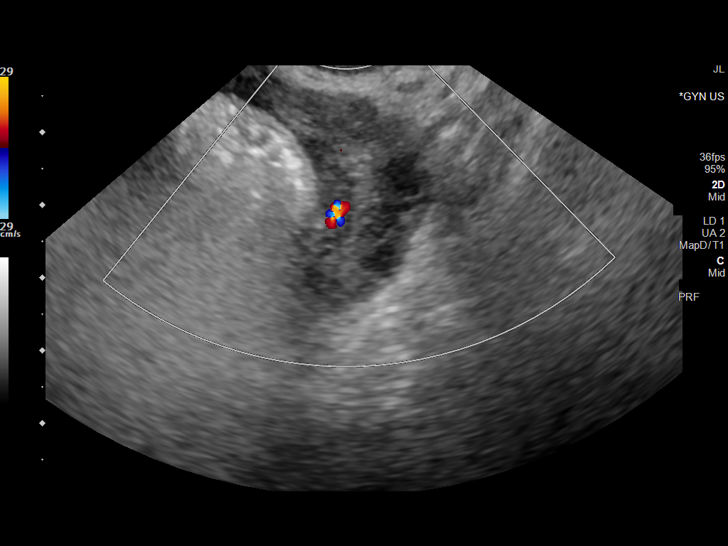
[im 68/68]
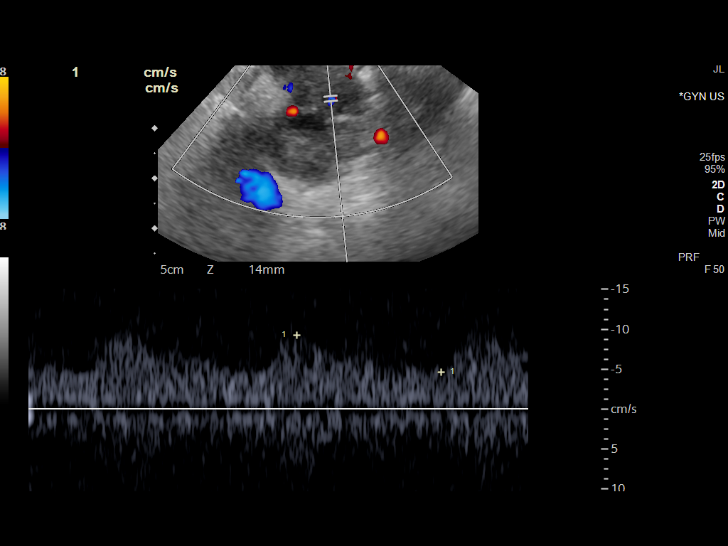

[13 of 25 positions shown; findings below may reference images not displayed]

FINDINGS: Uterus

Measurements: 7.6 x 3.4 x 4.1 cm = volume: 54 mL. No fibroids or
other mass visualized.

Endometrium

Thickness: 7 mm.  No focal abnormality visualized.

Right ovary

Measurements: 3.0 x 2.2 x 2.3 cm = volume: 7.9 mL. Normal
appearance/no adnexal mass.

Left ovary

Measurements: 3.3 x 2.9 x 1.8 cm = volume: 8.8 mL. Normal
appearance/no adnexal mass.

Pulsed Doppler evaluation of both ovaries demonstrates normal
low-resistance arterial and venous waveforms.

Other findings

Small volume of simple free fluid in the cul-de-sac.
IMPRESSION: Unremarkable pelvic ultrasound.  Normal blood flow to both ovaries.

## 2021-06-06 LAB — OB RESULTS CONSOLE GC/CHLAMYDIA: Chlamydia: NEGATIVE

## 2022-05-24 ENCOUNTER — Other Ambulatory Visit: Payer: Self-pay

## 2022-05-24 ENCOUNTER — Emergency Department (HOSPITAL_COMMUNITY)
Admission: EM | Admit: 2022-05-24 | Discharge: 2022-05-25 | Disposition: A | Discharge status: Home / Self Care | Payer: BC Managed Care – PPO | Attending: Emergency Medicine | Admitting: Emergency Medicine

## 2022-05-24 ENCOUNTER — Emergency Department (HOSPITAL_COMMUNITY): Payer: BC Managed Care – PPO

## 2022-05-24 ENCOUNTER — Encounter (HOSPITAL_COMMUNITY): Payer: Self-pay | Admitting: Emergency Medicine

## 2022-05-24 DIAGNOSIS — Z91010 Allergy to peanuts: Secondary | ICD-10-CM | POA: Insufficient documentation

## 2022-05-24 DIAGNOSIS — O0992 Supervision of high risk pregnancy, unspecified, second trimester: Secondary | ICD-10-CM | POA: Diagnosis not present

## 2022-05-24 DIAGNOSIS — O4690 Antepartum hemorrhage, unspecified, unspecified trimester: Secondary | ICD-10-CM

## 2022-05-24 DIAGNOSIS — O0001 Abdominal pregnancy with intrauterine pregnancy: Secondary | ICD-10-CM | POA: Insufficient documentation

## 2022-05-24 DIAGNOSIS — Z331 Pregnant state, incidental: Secondary | ICD-10-CM

## 2022-05-24 DIAGNOSIS — Z3A20 20 weeks gestation of pregnancy: Secondary | ICD-10-CM | POA: Diagnosis not present

## 2022-05-24 DIAGNOSIS — R1032 Left lower quadrant pain: Secondary | ICD-10-CM

## 2022-05-24 LAB — CBC
HCT: 33.2 % — ABNORMAL LOW (ref 36.0–46.0)
Hemoglobin: 11.1 g/dL — ABNORMAL LOW (ref 12.0–15.0)
MCH: 29.8 pg (ref 26.0–34.0)
MCHC: 33.4 g/dL (ref 30.0–36.0)
MCV: 89.2 fL (ref 80.0–100.0)
Platelets: 237 10*3/uL (ref 150–400)
RBC: 3.72 MIL/uL — ABNORMAL LOW (ref 3.87–5.11)
RDW: 14.1 % (ref 11.5–15.5)
WBC: 10.2 10*3/uL (ref 4.0–10.5)
nRBC: 0 % (ref 0.0–0.2)

## 2022-05-24 LAB — COMPREHENSIVE METABOLIC PANEL
ALT: 15 U/L (ref 0–44)
AST: 18 U/L (ref 15–41)
Albumin: 3.8 g/dL (ref 3.5–5.0)
Alkaline Phosphatase: 46 U/L (ref 38–126)
Anion gap: 8 (ref 5–15)
BUN: 7 mg/dL (ref 6–20)
CO2: 22 mmol/L (ref 22–32)
Calcium: 8.9 mg/dL (ref 8.9–10.3)
Chloride: 105 mmol/L (ref 98–111)
Creatinine, Ser: 0.43 mg/dL — ABNORMAL LOW (ref 0.44–1.00)
GFR, Estimated: >60 mL/min (ref >60)
Glucose, Bld: 80 mg/dL (ref 70–99)
Potassium: 3 mmol/L — ABNORMAL LOW (ref 3.5–5.1)
Sodium: 135 mmol/L (ref 135–145)
Total Bilirubin: 0.2 mg/dL — ABNORMAL LOW (ref 0.3–1.2)
Total Protein: 7 g/dL (ref 6.5–8.1)

## 2022-05-24 LAB — ABO/RH
ABO/RH(D): A NEG
Antibody Screen: NEGATIVE

## 2022-05-24 LAB — URINALYSIS, ROUTINE W REFLEX MICROSCOPIC
Bilirubin Urine: NEGATIVE
Glucose, UA: NEGATIVE mg/dL
Ketones, ur: NEGATIVE mg/dL
Leukocytes,Ua: NEGATIVE
Nitrite: NEGATIVE
Protein, ur: NEGATIVE mg/dL
Specific Gravity, Urine: 1 — ABNORMAL LOW (ref 1.005–1.030)
pH: 7 (ref 5.0–8.0)

## 2022-05-24 LAB — HCG, QUANTITATIVE, PREGNANCY: hCG, Beta Chain, Quant, S: 14950 m[IU]/mL — ABNORMAL HIGH (ref <5)

## 2022-05-24 LAB — LIPASE, BLOOD: Lipase: 37 U/L (ref 11–51)

## 2022-05-24 MED ORDER — RHO D IMMUNE GLOBULIN 1500 UNIT/2ML IJ SOSY
300.0000 ug | PREFILLED_SYRINGE | Freq: Once | INTRAMUSCULAR | Status: AC
  Administered 2022-05-25: 300 ug via INTRAMUSCULAR
  Filled 2022-05-24: qty 2

## 2022-05-24 MED ORDER — POTASSIUM CHLORIDE 20 MEQ PO PACK
40.0000 meq | PACK | Freq: Once | ORAL | Status: AC
  Administered 2022-05-24: 40 meq via ORAL
  Filled 2022-05-24: qty 2

## 2022-05-24 NOTE — ED Triage Notes (Signed)
Patient presents with abdominal pain she has had for a couple of months. When it started, she thought it was cramping from her cycle, but pain has persisted and is now  sharp shooting pain. She reports pain and bloating has worsened dramatically in the past 30 mins. She doe have an MD appointment on Monday.

## 2022-05-24 NOTE — ED Provider Notes (Signed)
[facility] AT [facility] Provider Note   CSN: [id number] Arrival date & time: 05/24/22  1950     History  Chief Complaint  Patient presents with   Abdominal Pain    Robin Lambert is a 32 y.o. female, history of pericarditis, CVA, who presents to the ED secondary to left lower quadrant abdominal pain, that is been going on for the last months.  She states that for the last 5 months she has intermittently had sharp stabbing pains in the left lower quadrant, radiating to her pelvis, that have been intermittent, and not associated with her menses.  She states she has always had painful periods, but she typically does have pain during her periods, not and times in between her periods.  She states that these pain can last for 1 minute to up to an hour.  It is sharp and stabbing, can bring her to her knees.  She notes that she had an episode of sharp stabbing pain today, and she decided to come to the ER because of so severe it made her want to vomit almost.  She also endorses a severe bloating and pain to her abdomen for the last 2 months.  She states that she has never gained weight, like this, and that she initially thought she was pregnant and took multiple pregnancy test that were negative.  She denies any vaginal discharge, urinary symptoms, and has been having bowel movements every day.    Home Medications Prior to Admission medications   Medication Sig Start Date End Date Taking? Authorizing Provider  albuterol (PROVENTIL HFA;VENTOLIN HFA) 108 (90 BASE) MCG/ACT inhaler Inhale 2 puffs into the lungs every 6 (six) hours as needed for wheezing or shortness of breath.   Yes [provider]  amphetamine-dextroamphetamine (ADDERALL XR) 20 MG 24 hr capsule Take 20 mg by mouth daily as needed (adhd).   Yes [provider]  ibuprofen (ADVIL,MOTRIN) 200 MG tablet Take 400 mg by mouth every 4 (four) hours as needed for fever, headache, mild pain,  moderate pain or cramping.    Yes [provider]  EPINEPHrine (EPIPEN JR) 0.15 MG/0.3ML injection Inject 0.15 mg into the muscle as needed for anaphylaxis.     [provider]  meclizine (ANTIVERT) 25 MG tablet Take 1 tablet (25 mg total) by mouth 3 (three) times daily as needed for dizziness. Patient not taking: Reported on 09/12/2016 08/11/15   Jaynie Crumble, PA-C      Allergies    Peanuts [peanut oil] and Penicillins    Review of Systems   Review of Systems  Gastrointestinal:  Positive for abdominal pain. Negative for nausea and vomiting.  Genitourinary:  Positive for pelvic pain.    Physical Exam Updated Vital Signs BP (!) 116/57   Pulse 83   Temp 98 F (36.7 C)   Resp 20   SpO2 97%  Physical Exam Vitals and nursing note reviewed.  Constitutional:      General: She is not in acute distress.    Appearance: She is well-developed.  HENT:     Head: Normocephalic and atraumatic.  Eyes:     Conjunctiva/sclera: Conjunctivae normal.  Cardiovascular:     Rate and Rhythm: Normal rate and regular rhythm.     Heart sounds: No murmur heard. Pulmonary:     Effort: Pulmonary effort is normal. No respiratory distress.     Breath sounds: Normal breath sounds.  Abdominal:     General: There is distension.  Tenderness: There is abdominal tenderness in the left lower quadrant.     Comments: +hard to palpation  Musculoskeletal:        General: No swelling.     Cervical back: Neck supple.  Skin:    General: Skin is warm and dry.     Capillary Refill: Capillary refill takes less than 2 seconds.  Neurological:     Mental Status: She is alert.  Psychiatric:        Mood and Affect: Mood normal.     ED Results / Procedures / Treatments   Labs (all labs ordered are listed, but only abnormal results are displayed) Labs Reviewed  COMPREHENSIVE METABOLIC PANEL - Abnormal; Notable for the following components:      Result Value   Potassium 3.0 (*)     Creatinine, Ser 0.43 (*)    Total Bilirubin 0.2 (*)    All other components within normal limits  CBC - Abnormal; Notable for the following components:   RBC 3.72 (*)    Hemoglobin 11.1 (*)    HCT 33.2 (*)    All other components within normal limits  URINALYSIS, ROUTINE W REFLEX MICROSCOPIC - Abnormal; Notable for the following components:   Color, Urine COLORLESS (*)    Specific Gravity, Urine 1.000 (*)    Hgb urine dipstick Chalice Philbert (*)    Bacteria, UA RARE (*)    All other components within normal limits  HCG, QUANTITATIVE, PREGNANCY - Abnormal; Notable for the following components:   hCG, Beta Chain, Quant, S 14,950 (*)    All other components within normal limits  LIPASE, BLOOD  I-STAT BETA HCG BLOOD, ED ([facility], [facility], [facility] ONLY)  ABO/RH  RH IG WORKUP (INCLUDES ABO/RH)    EKG None  Radiology No results found.  Procedures Procedures    Medications Ordered in ED Medications  rho (d) immune globulin (RHIG/RHOPHYLAC) injection 300 mcg (has no administration in time range)  potassium chloride (KLOR-CON) packet 40 mEq (40 mEq Oral Given 05/24/22 2211)    ED Course/ Medical Decision Making/ A&P                             Medical Decision Making Patient is a 32 year old female, who is having menstrual cycles, and was having left lower quadrant pain, has been going on for the last 5 months.  Pain is not associated with her menses.  We will obtain an ultrasound of this, as well as blood work, and beta-hCG.  Concern for possible  pregnancy, versus ovarian tumor.    Amount and/or Complexity of Data Reviewed Labs: ordered.    Details: Beta-hCG of 14,000 Radiology: ordered. Discussion of management or test interpretation with external provider(s): Discussed with patient, congratulated her on her pregnancy, she is happy about this, we are pending the ultrasound, for disposition.  We will give her RhoGAM as she is A negative, and is having vaginal bleeding.  Signed out to [facility], he  will disposition patient appropriately.  If ultrasound within normal limits, and suspicious that her left lower quadrant pain is likely secondary to ligament pain.  She has an appointment with gynecologist on Monday.  Risk Prescription drug management.   Final Clinical Impression(s) / ED Diagnoses Final diagnoses:  IUP (intrauterine pregnancy), incidental  LLQ pain  Vaginal bleeding during pregnancy    Rx / DC Orders ED Discharge Orders     None  Pete Pelt, [place] 05/24/22 2354    Jacalyn Lefevre, MD 05/25/22 Rickey Primus

## 2022-05-25 MED ORDER — VITAMIN B-6 25 MG PO TABS: 25.0000 mg | ORAL_TABLET | Freq: Three times a day (TID) | ORAL | 0 refills | Status: DC | PRN

## 2022-05-25 MED ORDER — ONDANSETRON 4 MG PO TBDP: 4.0000 mg | ORAL_TABLET | Freq: Three times a day (TID) | ORAL | 0 refills | Status: DC | PRN

## 2022-05-25 MED ORDER — ONDANSETRON 4 MG PO TBDP
4.0000 mg | ORAL_TABLET | Freq: Once | ORAL | Status: AC
  Administered 2022-05-25: 4 mg via ORAL
  Filled 2022-05-25: qty 1

## 2022-05-25 NOTE — Discharge Instructions (Addendum)
Keep your appointment with your OB/GYN.  I have given you a few tablets of Zofran to use for nausea should he need it however I have also written a prescription for some B vitamins which you may start taking for nausea.  I will hold off on prescribing a prenatal vitamin as your OB/GYN may have a specific regimen they recommend. Typically nausea associated with pregnancy improves over time (which means you should keep an eye out for worsening symptoms and talk to your OBGYN).   You also have a trace of bacteria in your urine. In pregnancy we culture this to make sure there is no evidence of a UTI -- discuss w your OBGYN as they should be able to see results.     Safe Medications in Pregnancy   Acne: Benzoyl Peroxide Salicylic Acid  Backache/Headache: Tylenol: 2 regular strength every 4 hours OR              2 Extra strength every 6 hours  Colds/Coughs/Allergies: Benadryl (alcohol free) 25 mg every 6 hours as needed Breath right strips Claritin Cepacol throat lozenges Chloraseptic throat spray Cold-Eeze- up to three times per day Cough drops, alcohol free Flonase (by prescription only) Guaifenesin Mucinex Robitussin DM (plain only, alcohol free) Saline nasal spray/drops Sudafed (pseudoephedrine) & Actifed ** use only after [redacted] weeks gestation and if you do not have high blood pressure Tylenol Vicks Vaporub Zinc lozenges Zyrtec   Constipation: Colace Ducolax suppositories Fleet enema Glycerin suppositories Metamucil Milk of magnesia Miralax Senokot Smooth move tea  Diarrhea: Kaopectate Imodium A-D  *NO pepto Bismol  Hemorrhoids: Anusol Anusol HC Preparation H Tucks  Indigestion: Tums Maalox Mylanta Zantac  Pepcid  Insomnia: Benadryl (alcohol free) 25mg  every 6 hours as needed Tylenol PM Unisom, no Gelcaps  Leg Cramps: Tums MagGel  Nausea/Vomiting:  Bonine Dramamine Emetrol Ginger extract Sea bands Meclizine  Nausea medication to take during  pregnancy:  Unisom (doxylamine succinate 25 mg tablets) Take one tablet daily at bedtime. If symptoms are not adequately controlled, the dose can be increased to a maximum recommended dose of two tablets daily (1/2 tablet in the morning, 1/2 tablet mid-afternoon and one at bedtime). Vitamin B6 100mg  tablets. Take one tablet twice a day (up to 200 mg per day).  Skin Rashes: Aveeno products Benadryl cream or 25mg  every 6 hours as needed Calamine Lotion 1% cortisone cream  Yeast infection: Gyne-lotrimin 7 Monistat 7  Gum/tooth pain: Anbesol  **If taking multiple medications, please check labels to avoid duplicating the same active ingredients **take medication as directed on the label ** Do not exceed 4000 mg of tylenol in 24 hours **Do not take medications that contain aspirin or ibuprofen

## 2022-05-27 LAB — RH IG WORKUP (INCLUDES ABO/RH)
ABO/RH(D): A NEG
Antibody Screen: NEGATIVE
Gestational Age(Wks): 20
Unit division: 0
Unit tag comment: 20

## 2022-05-31 LAB — HEPATITIS C ANTIBODY: HCV Ab: NEGATIVE

## 2022-05-31 LAB — OB RESULTS CONSOLE RPR: RPR: NONREACTIVE

## 2022-05-31 LAB — OB RESULTS CONSOLE RUBELLA ANTIBODY, IGM: Rubella: IMMUNE

## 2022-06-07 LAB — OB RESULTS CONSOLE HEPATITIS B SURFACE ANTIGEN: Hepatitis B Surface Ag: NEGATIVE

## 2022-06-07 LAB — OB RESULTS CONSOLE GC/CHLAMYDIA: Neisseria Gonorrhea: NEGATIVE

## 2022-07-09 LAB — OB RESULTS CONSOLE HIV ANTIBODY (ROUTINE TESTING): HIV: NONREACTIVE

## 2022-09-14 LAB — OB RESULTS CONSOLE GBS: GBS: NEGATIVE

## 2022-10-01 ENCOUNTER — Inpatient Hospital Stay (HOSPITAL_COMMUNITY)
Admission: RE | Admit: 2022-10-01 | Discharge: 2022-10-04 | DRG: 807 | Disposition: A | Discharge status: Home / Self Care | Payer: BC Managed Care – PPO | Attending: Obstetrics and Gynecology | Admitting: Obstetrics and Gynecology

## 2022-10-01 ENCOUNTER — Encounter (HOSPITAL_COMMUNITY): Payer: Self-pay

## 2022-10-01 DIAGNOSIS — Z3A39 39 weeks gestation of pregnancy: Secondary | ICD-10-CM

## 2022-10-01 DIAGNOSIS — Z6791 Unspecified blood type, Rh negative: Secondary | ICD-10-CM | POA: Diagnosis not present

## 2022-10-01 DIAGNOSIS — Z8673 Personal history of transient ischemic attack (TIA), and cerebral infarction without residual deficits: Secondary | ICD-10-CM | POA: Diagnosis not present

## 2022-10-01 DIAGNOSIS — L989 Disorder of the skin and subcutaneous tissue, unspecified: Secondary | ICD-10-CM | POA: Diagnosis present

## 2022-10-01 DIAGNOSIS — O9972 Diseases of the skin and subcutaneous tissue complicating childbirth: Secondary | ICD-10-CM | POA: Diagnosis present

## 2022-10-01 DIAGNOSIS — Z23 Encounter for immunization: Secondary | ICD-10-CM

## 2022-10-01 DIAGNOSIS — Z87442 Personal history of urinary calculi: Secondary | ICD-10-CM

## 2022-10-01 DIAGNOSIS — O26893 Other specified pregnancy related conditions, third trimester: Secondary | ICD-10-CM | POA: Diagnosis present

## 2022-10-01 DIAGNOSIS — Z3490 Encounter for supervision of normal pregnancy, unspecified, unspecified trimester: Principal | ICD-10-CM

## 2022-10-01 HISTORY — DX: Calculus of kidney: N20.0

## 2022-10-01 HISTORY — DX: Unspecified asthma, uncomplicated: J45.909

## 2022-10-01 LAB — CBC
HCT: 38.5 % (ref 36.0–46.0)
Hemoglobin: 13 g/dL (ref 12.0–15.0)
MCH: 29.3 pg (ref 26.0–34.0)
MCHC: 33.8 g/dL (ref 30.0–36.0)
MCV: 86.7 fL (ref 80.0–100.0)
Platelets: 222 10*3/uL (ref 150–400)
RBC: 4.44 MIL/uL (ref 3.87–5.11)
RDW: 13.4 % (ref 11.5–15.5)
WBC: 8.2 10*3/uL (ref 4.0–10.5)
nRBC: 0 % (ref 0.0–0.2)

## 2022-10-01 LAB — TYPE AND SCREEN
ABO/RH(D): A NEG
Antibody Screen: POSITIVE

## 2022-10-01 MED ORDER — LIDOCAINE HCL (PF) 1 % IJ SOLN: 30.0000 mL | INTRAMUSCULAR | Status: DC | PRN

## 2022-10-01 MED ORDER — ZOLPIDEM TARTRATE 5 MG PO TABS
5.0000 mg | ORAL_TABLET | Freq: Every evening | ORAL | Status: DC | PRN
  Administered 2022-10-01: 5 mg via ORAL
  Filled 2022-10-01: qty 1

## 2022-10-01 MED ORDER — OXYTOCIN-SODIUM CHLORIDE 30-0.9 UT/500ML-% IV SOLN
2.5000 [IU]/h | INTRAVENOUS | Status: DC
  Filled 2022-10-01: qty 500

## 2022-10-01 MED ORDER — ONDANSETRON HCL 4 MG/2ML IJ SOLN
4.0000 mg | Freq: Four times a day (QID) | INTRAMUSCULAR | Status: DC | PRN
  Administered 2022-10-02: 4 mg via INTRAVENOUS
  Filled 2022-10-01: qty 2

## 2022-10-01 MED ORDER — SOD CITRATE-CITRIC ACID 500-334 MG/5ML PO SOLN: 30.0000 mL | ORAL | Status: DC | PRN

## 2022-10-01 MED ORDER — FENTANYL CITRATE (PF) 100 MCG/2ML IJ SOLN
50.0000 ug | INTRAMUSCULAR | Status: DC | PRN
  Administered 2022-10-02: 50 ug via INTRAVENOUS
  Filled 2022-10-01: qty 2

## 2022-10-01 MED ORDER — OXYTOCIN BOLUS FROM INFUSION
333.0000 mL | Freq: Once | INTRAVENOUS | Status: AC
  Administered 2022-10-02: 333 mL via INTRAVENOUS

## 2022-10-01 MED ORDER — ACETAMINOPHEN 325 MG PO TABS: 650.0000 mg | ORAL_TABLET | ORAL | Status: DC | PRN

## 2022-10-01 MED ORDER — LACTATED RINGERS IV SOLN
500.0000 mL | INTRAVENOUS | Status: DC | PRN
  Administered 2022-10-02: 500 mL via INTRAVENOUS

## 2022-10-01 MED ORDER — OXYCODONE-ACETAMINOPHEN 5-325 MG PO TABS: 1.0000 | ORAL_TABLET | ORAL | Status: DC | PRN

## 2022-10-01 MED ORDER — TERBUTALINE SULFATE 1 MG/ML IJ SOLN: 0.2500 mg | Freq: Once | INTRAMUSCULAR | Status: DC | PRN

## 2022-10-01 MED ORDER — OXYCODONE-ACETAMINOPHEN 5-325 MG PO TABS: 2.0000 | ORAL_TABLET | ORAL | Status: DC | PRN

## 2022-10-01 MED ORDER — LACTATED RINGERS IV SOLN: INTRAVENOUS | Status: DC

## 2022-10-01 MED ORDER — MISOPROSTOL 25 MCG QUARTER TABLET
25.0000 ug | ORAL_TABLET | ORAL | Status: DC | PRN
  Administered 2022-10-01 – 2022-10-02 (×3): 25 ug via VAGINAL
  Filled 2022-10-01 (×3): qty 1

## 2022-10-01 NOTE — H&P (Signed)
Robin Lambert is a 32 y.o. female G1 at [redacted]w[redacted]d presenting for eIOL.  Patient received first dose of VMP at 1712.  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  H/O asthma and anxiety; both stable on no medication.  Rh negative.  GBS negative.  Last u/s 8/23 with EFW 6#7 (65%).   OB History     Gravida  1   Para      Term      Preterm      AB      Living         SAB      IAB      Ectopic      Multiple      Live Births             Past Medical History:  Diagnosis Date   Asthma    as a child   Kidney stone    32 Y.O.   Pericarditis    Stroke (HCC)    x2, 2009/2010, r/t gaurdasil   Past Surgical History:  Procedure Laterality Date   TONSILLECTOMY     Family History: family history is not on file. Social History:  reports that she has never smoked. She has never used smokeless tobacco. She reports current alcohol use. She reports that she does not use drugs.     Maternal Diabetes: No Genetic Screening: Normal Maternal Ultrasounds/Referrals: Normal Fetal Ultrasounds or other Referrals:  None Maternal Substance Abuse:  No Significant Maternal Medications:  None Significant Maternal Lab Results:  Group B Strep negative and Rh negative Number of Prenatal Visits:greater than 3 verified prenatal visits Other Comments:  None  Review of Systems Maternal Medical History:  Fetal activity: Perceived fetal activity is normal.   Last perceived fetal movement was within the past hour.   Prenatal complications: no prenatal complications Prenatal Complications - Diabetes: none.   Dilation: 1 Effacement (%): 50 Station: -3 Exam by:: Fredia Beets, RN Blood pressure 123/74, pulse 79, temperature 98.1 F (36.7 C), temperature source Oral, resp. rate 15, height 5\' 3"  (1.6 m), weight 61.1 kg. Maternal Exam:  Uterine Assessment: Contraction strength is mild.  Contraction frequency is irregular.   Abdomen: Patient reports no abdominal tenderness. Fundal height is c/w dates.   Estimated fetal weight is 8#.     Fetal Exam Fetal Monitor Review: Baseline rate: 125.  Variability: moderate (6-25 bpm).   Pattern: accelerations present and no decelerations.   Fetal State Assessment: Category I - tracings are normal.   Physical Exam Constitutional:      Appearance: Normal appearance.  HENT:     Head: Normocephalic and atraumatic.  Pulmonary:     Effort: Pulmonary effort is normal.  Abdominal:     Palpations: Abdomen is soft.  Musculoskeletal:        General: Normal range of motion.     Cervical back: Normal range of motion.  Skin:    General: Skin is warm and dry.  Neurological:     Mental Status: She is alert and oriented to person, place, and time.  Psychiatric:        Mood and Affect: Mood normal.        Behavior: Behavior normal.     Prenatal labs: ABO, Rh: --/--/A NEG (09/16 1542) Antibody: POS (09/16 1542) Rubella: Immune (05/16 0000) RPR: Nonreactive (05/16 0000)  HBsAg: Negative (05/23 0000)  HIV: Non-reactive (06/24 0000)  GBS: Negative/-- (08/30 0000)  Assessment/Plan: 32yo G1 at [redacted]w[redacted]d for eIOL -Continue VMP q 4h -CLEA if desired -Anticipate NSVD   Mitchel Honour 10/01/2022, 7:11 PM

## 2022-10-02 ENCOUNTER — Inpatient Hospital Stay (HOSPITAL_COMMUNITY): Payer: BC Managed Care – PPO | Admitting: Anesthesiology

## 2022-10-02 ENCOUNTER — Other Ambulatory Visit: Payer: Self-pay

## 2022-10-02 LAB — RPR: RPR Ser Ql: NONREACTIVE

## 2022-10-02 MED ORDER — EPHEDRINE 5 MG/ML INJ: 10.0000 mg | INTRAVENOUS | Status: DC | PRN

## 2022-10-02 MED ORDER — SENNOSIDES-DOCUSATE SODIUM 8.6-50 MG PO TABS
2.0000 | ORAL_TABLET | ORAL | Status: DC
  Administered 2022-10-03 – 2022-10-04 (×2): 2 via ORAL
  Filled 2022-10-02 (×2): qty 2

## 2022-10-02 MED ORDER — LACTATED RINGERS IV SOLN: 500.0000 mL | Freq: Once | INTRAVENOUS | Status: DC

## 2022-10-02 MED ORDER — FENTANYL-BUPIVACAINE-NACL 0.5-0.125-0.9 MG/250ML-% EP SOLN
12.0000 mL/h | EPIDURAL | Status: DC | PRN
  Administered 2022-10-02: 12 mL/h via EPIDURAL
  Filled 2022-10-02: qty 250

## 2022-10-02 MED ORDER — TETANUS-DIPHTH-ACELL PERTUSSIS 5-2.5-18.5 LF-MCG/0.5 IM SUSY: 0.5000 mL | PREFILLED_SYRINGE | Freq: Once | INTRAMUSCULAR | Status: DC

## 2022-10-02 MED ORDER — COCONUT OIL OIL: 1.0000 | TOPICAL_OIL | Status: DC | PRN

## 2022-10-02 MED ORDER — ACETAMINOPHEN 325 MG PO TABS
650.0000 mg | ORAL_TABLET | ORAL | Status: DC | PRN
  Administered 2022-10-03: 650 mg via ORAL
  Filled 2022-10-02: qty 2

## 2022-10-02 MED ORDER — PHENYLEPHRINE 80 MCG/ML (10ML) SYRINGE FOR IV PUSH (FOR BLOOD PRESSURE SUPPORT): 80.0000 ug | PREFILLED_SYRINGE | INTRAVENOUS | Status: DC | PRN

## 2022-10-02 MED ORDER — IBUPROFEN 600 MG PO TABS
600.0000 mg | ORAL_TABLET | Freq: Four times a day (QID) | ORAL | Status: DC
  Administered 2022-10-03 – 2022-10-04 (×6): 600 mg via ORAL
  Filled 2022-10-02 (×6): qty 1

## 2022-10-02 MED ORDER — PRENATAL MULTIVITAMIN CH
1.0000 | ORAL_TABLET | Freq: Every day | ORAL | Status: DC
  Administered 2022-10-03 – 2022-10-04 (×2): 1 via ORAL
  Filled 2022-10-02 (×2): qty 1

## 2022-10-02 MED ORDER — OXYCODONE HCL 5 MG PO TABS
5.0000 mg | ORAL_TABLET | ORAL | Status: DC | PRN
  Administered 2022-10-03: 5 mg via ORAL
  Filled 2022-10-02: qty 1

## 2022-10-02 MED ORDER — SIMETHICONE 80 MG PO CHEW: 80.0000 mg | CHEWABLE_TABLET | ORAL | Status: DC | PRN

## 2022-10-02 MED ORDER — DIPHENHYDRAMINE HCL 25 MG PO CAPS: 25.0000 mg | ORAL_CAPSULE | Freq: Four times a day (QID) | ORAL | Status: DC | PRN

## 2022-10-02 MED ORDER — OXYCODONE HCL 5 MG PO TABS
10.0000 mg | ORAL_TABLET | ORAL | Status: DC | PRN
  Administered 2022-10-03 (×2): 10 mg via ORAL
  Filled 2022-10-02 (×2): qty 2

## 2022-10-02 MED ORDER — ZOLPIDEM TARTRATE 5 MG PO TABS: 5.0000 mg | ORAL_TABLET | Freq: Every evening | ORAL | Status: DC | PRN

## 2022-10-02 MED ORDER — LIDOCAINE HCL (PF) 1 % IJ SOLN
INTRAMUSCULAR | Status: DC | PRN
  Administered 2022-10-02: 3 mL via EPIDURAL
  Administered 2022-10-02: 2 mL via EPIDURAL
  Administered 2022-10-02: 5 mL via EPIDURAL

## 2022-10-02 MED ORDER — ONDANSETRON HCL 4 MG/2ML IJ SOLN: 4.0000 mg | INTRAMUSCULAR | Status: DC | PRN

## 2022-10-02 MED ORDER — BENZOCAINE-MENTHOL 20-0.5 % EX AERO
1.0000 | INHALATION_SPRAY | CUTANEOUS | Status: DC | PRN
  Administered 2022-10-03: 1 via TOPICAL
  Filled 2022-10-02: qty 56

## 2022-10-02 MED ORDER — DIBUCAINE (PERIANAL) 1 % EX OINT: 1.0000 | TOPICAL_OINTMENT | CUTANEOUS | Status: DC | PRN

## 2022-10-02 MED ORDER — DIPHENHYDRAMINE HCL 50 MG/ML IJ SOLN: 12.5000 mg | INTRAMUSCULAR | Status: DC | PRN

## 2022-10-02 MED ORDER — OXYTOCIN-SODIUM CHLORIDE 30-0.9 UT/500ML-% IV SOLN
1.0000 m[IU]/min | INTRAVENOUS | Status: DC
  Administered 2022-10-02: 2 m[IU]/min via INTRAVENOUS

## 2022-10-02 MED ORDER — WITCH HAZEL-GLYCERIN EX PADS
1.0000 | MEDICATED_PAD | CUTANEOUS | Status: DC | PRN
  Administered 2022-10-03: 1 via TOPICAL

## 2022-10-02 MED ORDER — ONDANSETRON HCL 4 MG PO TABS: 4.0000 mg | ORAL_TABLET | ORAL | Status: DC | PRN

## 2022-10-02 MED ORDER — TERBUTALINE SULFATE 1 MG/ML IJ SOLN: 0.2500 mg | Freq: Once | INTRAMUSCULAR | Status: DC | PRN

## 2022-10-02 NOTE — Progress Notes (Signed)
Delivery Note At 9:35 PM a viable female was delivered via Vaginal, Spontaneous (Presentation: Right Occiput Anterior).  APGAR: 8, 9; weight  .   Placenta status: Spontaneous, Intact.  Cord: 3 vessels with the following complications: None.  Cord pH:   Anesthesia: Epidural Episiotomy: None Lacerations: 2nd degree;Periurethral bilat repaired. Small left sulcus lac repaired Suture Repair: 2.0 vicryl rapide Est. Blood Loss (mL): 150  Mom to postpartum.  Baby to Couplet care / Skin to Skin.  1.5 cm pedunculated lesion left buttock. D/W patient>she wants it removed. Prepped with betadine. Simple resection with a knife. 2-0 Rapide single suture.  Robin Lambert 10/02/2022, 9:59 PM

## 2022-10-02 NOTE — Progress Notes (Signed)
SROM> trickle clear fluid Cx 1-2/90/-2 AROM of forebag>clear

## 2022-10-02 NOTE — Anesthesia Procedure Notes (Signed)
Epidural Patient location during procedure: OB Start time: 10/02/2022 2:15 PM End time: 10/02/2022 2:21 PM  Staffing Anesthesiologist: Marcene Duos, MD Performed: anesthesiologist   Preanesthetic Checklist Completed: patient identified, IV checked, site marked, risks and benefits discussed, surgical consent, monitors and equipment checked, pre-op evaluation and timeout performed  Epidural Patient position: sitting Prep: DuraPrep and site prepped and draped Patient monitoring: continuous pulse ox and blood pressure Approach: midline Location: L4-L5 Injection technique: LOR air  Needle:  Needle type: Tuohy  Needle gauge: 17 G Needle length: 9 cm and 9 Needle insertion depth: 4 cm Catheter type: closed end flexible Catheter size: 19 Gauge Catheter at skin depth: 9 cm Test dose: negative  Assessment Events: blood not aspirated, no cerebrospinal fluid, injection not painful, no injection resistance, no paresthesia and negative IV test

## 2022-10-02 NOTE — Progress Notes (Signed)
FHT cat one UCs Q 2-4 Cx 3-4/C/0

## 2022-10-02 NOTE — Progress Notes (Signed)
FHT cat one Cx 2/C/-2 UCs q4 min Comfortable with epidural  A/P: D/W patient, all questions answered> start pitocin

## 2022-10-02 NOTE — Anesthesia Preprocedure Evaluation (Addendum)
Anesthesia Evaluation  Patient identified by MRN, date of birth, ID band Patient awake    Reviewed: Allergy & Precautions, Patient's Chart, lab work & pertinent test results  Airway Mallampati: II  TM Distance: >3 FB     Dental   Pulmonary asthma    Pulmonary exam normal        Cardiovascular negative cardio ROS Normal cardiovascular exam     Neuro/Psych CVA    GI/Hepatic negative GI ROS, Neg liver ROS,,,  Endo/Other  negative endocrine ROS    Renal/GU Renal disease     Musculoskeletal   Abdominal   Peds  Hematology negative hematology ROS (+)   Anesthesia Other Findings   Reproductive/Obstetrics (+) Pregnancy                             Anesthesia Physical Anesthesia Plan  ASA: 2  Anesthesia Plan: Epidural   Post-op Pain Management:    Induction:   PONV Risk Score and Plan: 2  Airway Management Planned: Natural Airway  Additional Equipment:   Intra-op Plan:   Post-operative Plan:   Informed Consent: I have reviewed the patients History and Physical, chart, labs and discussed the procedure including the risks, benefits and alternatives for the proposed anesthesia with the patient or authorized representative who has indicated his/her understanding and acceptance.       Plan Discussed with:   Anesthesia Plan Comments:        Anesthesia Quick Evaluation

## 2022-10-03 ENCOUNTER — Encounter (HOSPITAL_COMMUNITY): Payer: Self-pay | Admitting: Obstetrics & Gynecology

## 2022-10-03 MED ORDER — ACETAMINOPHEN 325 MG PO TABS
650.0000 mg | ORAL_TABLET | Freq: Four times a day (QID) | ORAL | Status: DC
  Administered 2022-10-03 – 2022-10-04 (×3): 650 mg via ORAL
  Filled 2022-10-03 (×3): qty 2

## 2022-10-03 MED ORDER — RHO D IMMUNE GLOBULIN 1500 UNIT/2ML IJ SOSY
300.0000 ug | PREFILLED_SYRINGE | Freq: Once | INTRAMUSCULAR | Status: AC
  Administered 2022-10-03: 300 ug via INTRAVENOUS
  Filled 2022-10-03: qty 2

## 2022-10-03 MED ORDER — GABAPENTIN 300 MG PO CAPS
300.0000 mg | ORAL_CAPSULE | Freq: Every day | ORAL | Status: DC
  Administered 2022-10-03: 300 mg via ORAL
  Filled 2022-10-03: qty 1

## 2022-10-03 MED ORDER — HYDROMORPHONE HCL 1 MG/ML IJ SOLN
0.2500 mg | INTRAMUSCULAR | Status: DC | PRN
  Administered 2022-10-03: 0.25 mg via INTRAVENOUS
  Filled 2022-10-03: qty 1

## 2022-10-03 NOTE — Social Work (Signed)
[name] was referred for history of depression/anxiety.  * Referral screened out by Clinical Social Worker because none of the following criteria appear to apply:  ~ History of anxiety/depression during this pregnancy, or of post-partum depression following prior delivery.  ~ Diagnosis of anxiety and/or depression within last 3 years OR * [name]'s symptoms currently being treated with medication and/or therapy.  Per chart review, [name] was diagnosed with in 2017. Per OB records, No concerns noted during pregnancy.   Please contact the Clinical Social Worker if needs arise, by [facility] request, or if [name] scores greater than 9/yes to question 10 on Edinburgh Postpartum Depression Screen.  Robin Lambert, LCSWA Clinical Social Worker [phone]

## 2022-10-03 NOTE — Progress Notes (Signed)
Postpartum Progress Note  S: No complaints. Feeling well - having mild uterine cramping. Lochia appropriate. No subjective fevers/chills.   O:     10/03/2022    5:05 AM 10/03/2022   12:46 AM 10/02/2022   11:50 PM  Vitals with BMI  Systolic 129 123 086  Diastolic 77 85 76  Pulse 55 64 77    Gen: NAD, A&O Pulm: NWOB Abd: soft, appropriately ttp, fundus firm and below Umb Ext: No evidence of DVT, trace edema b/l  Labs Recent Results (from the past 2160 hour(s))  OB RESULTS CONSOLE HIV antibody     Status: None   Collection Time: 07/09/22 12:00 AM  Result Value Ref Range   HIV Non-reactive   OB RESULT CONSOLE Group B Strep     Status: None   Collection Time: 09/14/22 12:00 AM  Result Value Ref Range   GBS Negative   Type and screen [facility]     Status: None   Collection Time: 10/01/22  3:42 PM  Result Value Ref Range   ABO/RH(D) A NEG    Antibody Screen POS    Sample Expiration 10/04/2022,2359    Antibody Identification      PASSIVELY ACQUIRED ANTI-D Performed at [facility], [address] [place], [place] [address]   CBC     Status: None   Collection Time: 10/01/22  4:42 PM  Result Value Ref Range   WBC 8.2 4.0 - 10.5 K/uL   RBC 4.44 3.87 - 5.11 MIL/uL   Hemoglobin 13.0 12.0 - 15.0 g/dL   HCT 96.2 95.2 - 84.1 %   MCV 86.7 80.0 - 100.0 fL   MCH 29.3 26.0 - 34.0 pg   MCHC 33.8 30.0 - 36.0 g/dL   RDW 32.4 40.1 - 02.7 %   Platelets 222 150 - 400 K/uL   nRBC 0.0 0.0 - 0.2 %    Comment: Performed at [facility], [address], [place], [place] [address]  RPR     Status: None   Collection Time: 10/01/22  4:42 PM  Result Value Ref Range   RPR Ser Ql NON REACTIVE NON REACTIVE    Comment: Performed at [facility], [address] [place], [place] [address]  CBC     Status: Abnormal   Collection Time: 10/03/22  7:04 AM  Result Value Ref Range   WBC 13.1 (H) 4.0 - 10.5 K/uL   RBC 3.88 3.87 - 5.11 MIL/uL   Hemoglobin 12.0 12.0  - 15.0 g/dL   HCT 74.2 (L) 59.5 - 63.8 %   MCV 87.9 80.0 - 100.0 fL   MCH 30.9 26.0 - 34.0 pg   MCHC 35.2 30.0 - 36.0 g/dL   RDW 75.6 43.3 - 29.5 %   Platelets 173 150 - 400 K/uL   nRBC 0.0 0.0 - 0.2 %    Comment: Performed at [facility], [address], [place], [place] [address]     A/P:  PPD1 s/p SVD, doing well pp. AFVSS. Benign exam.  Hx TIA and endocarditis after Gardisil - no long term sequelae History of asthma - no inhaler use recently History of anxiety - stable mood, no meds Rh neg - baby boy Rh pos, for rhogam Discussed circumcision in detail, they desire - not yet cleared by peds. Will perform when able. Continue present care. Plan for likely d/c PPD#2.   Jule Economy, MD

## 2022-10-03 NOTE — Progress Notes (Signed)
Brief Progress Note  Notified by RN that patient experiencing perineal pain. Presented to bedside immediately after notification. Patient s/p second dose of oxycodone, feeling improved.  On examination, perineal laceration appears well-approximated without significant erythema. Appropriate tender, not exquisitely so. Buttock lesion removal site also examined, appears to be well-approximated without obvious evidence of infection. We discussed my exam findings and appropriate Dermoplast application technique.  Will add on nighttime dose of gabapentin as she describes the pain as burning. Discussed a better pain medication schedule as to not get behind. Will also add on prn dilaudid IV for breakthrough.  Jule Economy, MD

## 2022-10-03 NOTE — Lactation Note (Signed)
This note was copied from a baby's chart. Lactation Consultation Note Mom stated BF going very well. Mom is pleased on how good it is going.  Mom demonstrated hand expression w/colostrum noted. Praised mom. Newborn feeding habits, behavior, STS, I&O, body alignment, support, milk storage, reviewed. Mom encouraged to feed baby 8-12 times/24 hours and with feeding cues.  Encouraged mom to call for LC to see latch tonight.  Patient Name: Robin Lambert [id number] Date: 10/03/2022 Age:33 hours Reason for consult: Initial assessment;Primapara;Term   Maternal Data Has patient been taught Hand Expression?: Yes Does the patient have breastfeeding experience prior to this delivery?: No  Feeding    LATCH Score       Type of Nipple: Everted at rest and after stimulation  Comfort (Breast/Nipple): Soft / non-tender         Lactation Tools Discussed/Used    Interventions Interventions: Breast feeding basics reviewed;Hand express;Breast compression;Position options;Education;LC Services brochure  Discharge Discharge Education: Outpatient recommendation  Consult Status Consult Status: Follow-up Date: 10/04/22 Follow-up type: In-patient    Charyl Dancer 10/03/2022, 9:18 PM

## 2022-10-03 NOTE — Anesthesia Postprocedure Evaluation (Signed)
Anesthesia Post Note  Patient: Robin Lambert  Procedure(s) Performed: AN AD HOC LABOR EPIDURAL     Patient location during evaluation: Mother Baby Anesthesia Type: Epidural Level of consciousness: awake and alert Pain management: pain level controlled Vital Signs Assessment: post-procedure vital signs reviewed and stable Respiratory status: spontaneous breathing, nonlabored ventilation and respiratory function stable Cardiovascular status: stable Postop Assessment: no headache, no backache and epidural receding Anesthetic complications: no   No notable events documented.  Last Vitals:  Vitals:   10/03/22 0046 10/03/22 0505  BP: 123/85 129/77  Pulse: 64 (!) 55  Resp: 16 16  Temp: 36.9 C 36.9 C  SpO2: 99% 99%    Last Pain:  Vitals:   10/03/22 0735  TempSrc:   PainSc: 0-No pain   Pain Goal:                   [employer]

## 2022-10-04 LAB — RH IG WORKUP (INCLUDES ABO/RH)
Fetal Screen: NEGATIVE
Gestational Age(Wks): 39
Unit division: 0

## 2022-10-04 LAB — SURGICAL PATHOLOGY

## 2022-10-04 MED ORDER — OXYCODONE HCL 5 MG PO TABS: 5.0000 mg | ORAL_TABLET | ORAL | 0 refills | Status: AC | PRN

## 2022-10-04 MED ORDER — IBUPROFEN 600 MG PO TABS: 600.0000 mg | ORAL_TABLET | Freq: Four times a day (QID) | ORAL | 0 refills | Status: AC

## 2022-10-04 NOTE — Lactation Note (Signed)
This note was copied from a baby's chart. Lactation Consultation Note  Patient Name: Robin Lambert [id number] Date: 10/04/2022 Age:33 hours Reason for consult: Follow-up assessment;Mother's request;Primapara;1st time breastfeeding;Term  P1- [name] wanted to go over the parts of a Motif pump that she has at home. Pump was not present. LC educated [name] on what all of the parts are called and what they are used for. LC encouraged [name] to watch YouTube videos from the manufacturer on how to work it. LC sized [name] for 21-24 flanges and educated her on how to tell if they are the correct fit.  [name]'s nurse was in the room going over output. [name] stated that she may have missed some pee diapers because she did not know that the blue line on the diaper meant urine.  [name] denies further questions or concerns. LC encouraged [name] to reach out for further assistance if needed.  Maternal Data Does the patient have breastfeeding experience prior to this delivery?: No  Feeding Mother's Current Feeding Choice: Breast Milk  Interventions Interventions: Breast feeding basics reviewed;DEBP;Education;Guidelines for Milk Supply and Pumping Schedule Handout;Pace feeding  Discharge Discharge Education: Engorgement and breast care;Warning signs for feeding baby Pump: DEBP;Personal (Motif DEBP)  Consult Status Consult Status: Complete Date: 10/04/22    Dema Severin BS, IBCLC 10/04/2022, 11:39 AM

## 2022-10-04 NOTE — Progress Notes (Signed)
CSW received and acknowledges consult for EDPS of 9.  Consult screened out due to 9 on EDPS does not warrant a CSW consult.  [name] whom scores are greater than 9/yes to question 10 on [place] Postpartum Depression Screen warrants a CSW consult.   Jhalen Eley, LCSWA Clinical Social Worker [phone]  

## 2022-10-04 NOTE — Plan of Care (Signed)

## 2022-10-04 NOTE — Lactation Note (Signed)
This note was copied from a baby's chart. Lactation Consultation Note  Patient Name: Robin Lambert EXBMW'[id number] Date: 10/04/2022 Age:33 hours Reason for consult: Follow-up assessment;Primapara;1st time breastfeeding;Term  P1- [name] states that infant is nursing very well and causes no pain/discomfort. [name] has no questions or concerns at this time.  LC reviewed feeding infant 8-12x in 24 hrs, not allowing infant to go over 3 hrs without a feed, expected output, engorgement protocol, and LC services handout. LC encouraged [name] to call if she needs further assistance.  Maternal Data Does the patient have breastfeeding experience prior to this delivery?: No  Feeding Mother's Current Feeding Choice: Breast Milk  Interventions Interventions: Breast feeding basics reviewed;Education;LC Services brochure  Discharge Discharge Education: Engorgement and breast care;Warning signs for feeding baby Pump: DEBP;Personal  Consult Status Consult Status: Complete Date: 10/04/22    Dema Severin BS, IBCLC 10/04/2022, 10:06 AM

## 2022-10-04 NOTE — Discharge Summary (Signed)
Postpartum Discharge Summary  Date of ServiceSeptember 19, 2024     Patient Name: Robin Lambert DOB: 10-02-1990 MRN: [id number]  Date of admission: 10/01/2022 Delivery date:10/02/2022 Delivering provider: Harold Hedge Date of discharge: 10/04/2022  Admitting diagnosis: Pregnancy [Z34.90] Intrauterine pregnancy: [redacted]w[redacted]d     Secondary diagnosis:  Principal Problem:   Pregnancy  Additional problems: none    Discharge diagnosis: Term Pregnancy Delivered                                              Post partum procedures: not applicable Augmentation: AROM, Pitocin, and Cytotec Complications: None  Hospital course: Induction of Labor With Vaginal Delivery   32 y.o. yo G1P1001 at [redacted]w[redacted]d was admitted to the hospital 10/01/2022 for induction of labor.  Indication for induction: Elective.  Patient had an labor course complicated by nothing Membrane Rupture Time/Date: 12:48 PM,10/02/2022  Delivery Method:Vaginal, Spontaneous Operative Delivery:N/A Episiotomy: None Lacerations:  2nd degree;Periurethral Details of delivery can be found in separate delivery note.  Patient had a postpartum course complicated bynothing. Patient is discharged home 10/04/22.  Newborn Data: Birth date:10/02/2022 Birth time:9:35 PM Gender:Female Living status:Living Apgars:8 ,9  Weight:3000 g  Magnesium Sulfate received: No BMZ received: No Rhophylac:N/A MMR:N/A T-DaP:Given prenatally Flu: N/A Transfusion:No  Physical exam  Vitals:   10/03/22 0900 10/03/22 1300 10/03/22 2212 10/04/22 0508  BP: 113/76 121/85 126/87 122/81  Pulse: 63 (!) 58 (!) 50 (!) 56  Resp: 18 18 15 16   Temp: 97.8 F (36.6 C) 97.7 F (36.5 C) 97.8 F (36.6 C) 98.2 F (36.8 C)  TempSrc: Oral Oral Oral Oral  SpO2: 98% 100% 99% 99%  Weight:      Height:       General: alert, cooperative, and no distress Lochia: appropriate Uterine Fundus: firm Incision: Healing well with no significant drainage DVT Evaluation: No  evidence of DVT seen on physical exam. Labs: Lab Results  Component Value Date   WBC 13.1 (H) 10/03/2022   HGB 12.0 10/03/2022   HCT 34.1 (L) 10/03/2022   MCV 87.9 10/03/2022   PLT 173 10/03/2022      Latest Ref Rng & Units 05/24/2022    8:33 PM  CMP  Glucose 70 - 99 mg/dL 80   BUN 6 - 20 mg/dL 7   Creatinine 2.95 - 2.84 mg/dL 1.32   Sodium 440 - 102 mmol/L 135   Potassium 3.5 - 5.1 mmol/L 3.0   Chloride 98 - 111 mmol/L 105   CO2 22 - 32 mmol/L 22   Calcium 8.9 - 10.3 mg/dL 8.9   Total Protein 6.5 - 8.1 g/dL 7.0   Total Bilirubin 0.3 - 1.2 mg/dL 0.2   Alkaline Phos 38 - 126 U/L 46   AST 15 - 41 U/L 18   ALT 0 - 44 U/L 15    Edinburgh Score:    10/03/2022    4:00 PM  Edinburgh Postnatal Depression Scale Screening Tool  I have been able to laugh and see the funny side of things. 0  I have looked forward with enjoyment to things. 0  I have blamed myself unnecessarily when things went wrong. 3  I have been anxious or worried for no good reason. 2  I have felt scared or panicky for no good reason. 1  Things have been getting on top of me. 1  I have been so unhappy that I have had difficulty sleeping. 2  I have felt sad or miserable. 0  I have been so unhappy that I have been crying. 0  The thought of harming myself has occurred to me. 0  Edinburgh Postnatal Depression Scale Total 9      After visit meds:  Allergies as of 10/04/2022       Reactions   Peanuts [peanut Oil] Anaphylaxis   Gardasil [human Papillomavirus 4-valent Recombinant Vaccine] Other (See Comments)   Stroke    Penicillins Other (See Comments)   Unknown, childhood reaction        Medication List     TAKE these medications    ferrous sulfate 324 MG Tbec Take 324 mg by mouth daily with breakfast.   ibuprofen 600 MG tablet Commonly known as: ADVIL Take 1 tablet (600 mg total) by mouth every 6 (six) hours.   oxyCODONE 5 MG immediate release tablet Commonly known as: Oxy IR/ROXICODONE Take 1  tablet (5 mg total) by mouth every 4 (four) hours as needed (pain scale 4-7).   PRENATAL PO Take 1 tablet by mouth daily.         Discharge home in stable condition Infant Feeding: Breast Infant Disposition:home with mother Discharge instruction: per After Visit Summary and Postpartum booklet. Activity: Advance as tolerated. Pelvic rest for 6 weeks.  Diet: routine diet Anticipated Birth Control: Unsure Postpartum Appointment:6 weeks Additional Postpartum F/U:  not applicable Future Appointments:No future appointments. Follow up Visit:      10/04/2022 Jeani Hawking, MD

## 2022-10-04 NOTE — Discharge Instructions (Signed)
WHAT TO LOOK OUT FOR: Fever of 100.4 or above Mastitis: feels like flu and breasts hurt Infection: increased pain, swelling or redness Blood clots golf ball size or larger Postpartum depression   Congratulations on your newest addition!

## 2022-10-13 ENCOUNTER — Inpatient Hospital Stay (HOSPITAL_COMMUNITY): Payer: BC Managed Care – PPO

## 2022-10-30 ENCOUNTER — Telehealth (HOSPITAL_COMMUNITY): Payer: Self-pay

## 2022-10-30 NOTE — Telephone Encounter (Signed)
10/30/2022 1314  Name: Robin Lambert MRN: [id number] DOB: 1990-05-10  Reason for Call:  Transition of Care Hospital Discharge Call  Contact Status: Patient Contact Status: Message  Language assistant needed: Interpreter Mode: Interpreter Not Needed        Follow-Up Questions:    Inocente Salles Postnatal Depression Scale:  In the Past 7 Days:    PHQ2-9 Depression Scale:     Discharge Follow-up:    Post-discharge interventions: NA  Signature  Signe Colt
